# Patient Record
Sex: Female | Born: 1965 | Race: White | Hispanic: No | Marital: Married | Smoking: Former smoker
Health system: Southern US, Community
[De-identification: ages and names within clinical notes are randomized; demographics above are authoritative.]

## PROBLEM LIST (undated history)

## (undated) DIAGNOSIS — Z9889 Other specified postprocedural states: Secondary | ICD-10-CM

## (undated) DIAGNOSIS — O039 Complete or unspecified spontaneous abortion without complication: Secondary | ICD-10-CM

## (undated) DIAGNOSIS — G43909 Migraine, unspecified, not intractable, without status migrainosus: Secondary | ICD-10-CM

## (undated) DIAGNOSIS — I10 Essential (primary) hypertension: Secondary | ICD-10-CM

## (undated) DIAGNOSIS — E119 Type 2 diabetes mellitus without complications: Secondary | ICD-10-CM

## (undated) DIAGNOSIS — T4145XA Adverse effect of unspecified anesthetic, initial encounter: Secondary | ICD-10-CM

## (undated) DIAGNOSIS — R112 Nausea with vomiting, unspecified: Secondary | ICD-10-CM

## (undated) DIAGNOSIS — T8859XA Other complications of anesthesia, initial encounter: Secondary | ICD-10-CM

## (undated) DIAGNOSIS — N643 Galactorrhea not associated with childbirth: Secondary | ICD-10-CM

## (undated) DIAGNOSIS — O926 Galactorrhea: Secondary | ICD-10-CM

## (undated) HISTORY — DX: Migraine, unspecified, not intractable, without status migrainosus: G43.909

## (undated) HISTORY — DX: Galactorrhea not associated with childbirth: N64.3

## (undated) HISTORY — DX: Complete or unspecified spontaneous abortion without complication: O03.9

## (undated) HISTORY — PX: SALPINGOOPHORECTOMY: SHX82

## (undated) HISTORY — PX: KNEE ARTHROSCOPY: SUR90

## (undated) HISTORY — DX: Galactorrhea: O92.6

## (undated) HISTORY — DX: Essential (primary) hypertension: I10

## (undated) HISTORY — PX: DILATION AND CURETTAGE OF UTERUS: SHX78

## (undated) HISTORY — PX: ECTOPIC PREGNANCY SURGERY: SHX613

---

## 2015-07-24 ENCOUNTER — Emergency Department (HOSPITAL_COMMUNITY): Payer: BC Managed Care – PPO

## 2015-07-24 ENCOUNTER — Encounter (HOSPITAL_COMMUNITY): Payer: Self-pay | Admitting: Emergency Medicine

## 2015-07-24 ENCOUNTER — Inpatient Hospital Stay (HOSPITAL_COMMUNITY)
Admission: EM | Admit: 2015-07-24 | Discharge: 2015-07-25 | DRG: 313 | Disposition: A | Discharge status: Home / Self Care | Payer: BC Managed Care – PPO | Attending: Student in an Organized Health Care Education/Training Program | Admitting: Student in an Organized Health Care Education/Training Program

## 2015-07-24 DIAGNOSIS — Z7984 Long term (current) use of oral hypoglycemic drugs: Secondary | ICD-10-CM | POA: Diagnosis not present

## 2015-07-24 DIAGNOSIS — Z79899 Other long term (current) drug therapy: Secondary | ICD-10-CM

## 2015-07-24 DIAGNOSIS — I10 Essential (primary) hypertension: Secondary | ICD-10-CM | POA: Diagnosis present

## 2015-07-24 DIAGNOSIS — R0789 Other chest pain: Secondary | ICD-10-CM | POA: Diagnosis present

## 2015-07-24 DIAGNOSIS — R079 Chest pain, unspecified: Secondary | ICD-10-CM | POA: Diagnosis present

## 2015-07-24 DIAGNOSIS — Z87891 Personal history of nicotine dependence: Secondary | ICD-10-CM | POA: Diagnosis not present

## 2015-07-24 DIAGNOSIS — E1165 Type 2 diabetes mellitus with hyperglycemia: Secondary | ICD-10-CM | POA: Diagnosis present

## 2015-07-24 DIAGNOSIS — Z833 Family history of diabetes mellitus: Secondary | ICD-10-CM | POA: Diagnosis not present

## 2015-07-24 HISTORY — DX: Type 2 diabetes mellitus without complications: E11.9

## 2015-07-24 LAB — COMPREHENSIVE METABOLIC PANEL
ALBUMIN: 3.5 g/dL (ref 3.5–5.0)
ALK PHOS: 80 U/L (ref 38–126)
ALT: 18 U/L (ref 14–54)
ANION GAP: 10 (ref 5–15)
AST: 33 U/L (ref 15–41)
BUN: 14 mg/dL (ref 6–20)
CALCIUM: 8.7 mg/dL — AB (ref 8.9–10.3)
CO2: 24 mmol/L (ref 22–32)
Chloride: 101 mmol/L (ref 101–111)
Creatinine, Ser: 0.64 mg/dL (ref 0.44–1.00)
GFR calc Af Amer: >60 mL/min (ref >60)
GFR calc non Af Amer: >60 mL/min (ref >60)
GLUCOSE: 352 mg/dL — AB (ref 65–99)
Potassium: 4.1 mmol/L (ref 3.5–5.1)
SODIUM: 135 mmol/L (ref 135–145)
Total Bilirubin: 0.9 mg/dL (ref 0.3–1.2)
Total Protein: 6.5 g/dL (ref 6.5–8.1)

## 2015-07-24 LAB — CBC WITH DIFFERENTIAL/PLATELET
BASOS ABS: 0 10*3/uL (ref 0.0–0.1)
BASOS PCT: 0 %
EOS ABS: 0.2 10*3/uL (ref 0.0–0.7)
Eosinophils Relative: 3 %
HCT: 41.9 % (ref 36.0–46.0)
HEMOGLOBIN: 13.9 g/dL (ref 12.0–15.0)
Lymphocytes Relative: 29 %
Lymphs Abs: 2.3 10*3/uL (ref 0.7–4.0)
MCH: 28.4 pg (ref 26.0–34.0)
MCHC: 33.2 g/dL (ref 30.0–36.0)
MCV: 85.5 fL (ref 78.0–100.0)
Monocytes Absolute: 0.5 10*3/uL (ref 0.1–1.0)
Monocytes Relative: 6 %
NEUTROS PCT: 62 %
Neutro Abs: 5.1 10*3/uL (ref 1.7–7.7)
Platelets: 198 10*3/uL (ref 150–400)
RBC: 4.9 MIL/uL (ref 3.87–5.11)
RDW: 14.3 % (ref 11.5–15.5)
WBC: 8 10*3/uL (ref 4.0–10.5)

## 2015-07-24 LAB — I-STAT TROPONIN, ED: TROPONIN I, POC: 0 ng/mL (ref 0.00–0.08)

## 2015-07-24 LAB — POC URINE PREG, ED: Preg Test, Ur: NEGATIVE

## 2015-07-24 LAB — CBG MONITORING, ED: Glucose-Capillary: 331 mg/dL — ABNORMAL HIGH (ref 65–99)

## 2015-07-24 LAB — GLUCOSE, CAPILLARY: GLUCOSE-CAPILLARY: 350 mg/dL — AB (ref 65–99)

## 2015-07-24 LAB — D-DIMER, QUANTITATIVE: D-Dimer, Quant: 0.37 ug/mL-FEU (ref 0.00–0.48)

## 2015-07-24 MED ORDER — SODIUM CHLORIDE 0.9 % IJ SOLN: 3.0000 mL | INTRAMUSCULAR | Status: DC | PRN

## 2015-07-24 MED ORDER — ENOXAPARIN SODIUM 40 MG/0.4ML ~~LOC~~ SOLN
40.0000 mg | SUBCUTANEOUS | Status: DC
Start: 2015-07-25 — End: 2015-07-25
  Filled 2015-07-24: qty 0.4

## 2015-07-24 MED ORDER — ASPIRIN EC 81 MG PO TBEC
81.0000 mg | DELAYED_RELEASE_TABLET | Freq: Every day | ORAL | Status: DC
  Administered 2015-07-25: 81 mg via ORAL
  Filled 2015-07-24: qty 1

## 2015-07-24 MED ORDER — ATORVASTATIN CALCIUM 40 MG PO TABS
40.0000 mg | ORAL_TABLET | Freq: Every day | ORAL | Status: DC
  Administered 2015-07-24: 40 mg via ORAL
  Filled 2015-07-24: qty 1

## 2015-07-24 MED ORDER — INSULIN ASPART 100 UNIT/ML ~~LOC~~ SOLN
0.0000 [IU] | SUBCUTANEOUS | Status: DC
  Administered 2015-07-24: 11 [IU] via SUBCUTANEOUS
  Administered 2015-07-25: 8 [IU] via SUBCUTANEOUS
  Administered 2015-07-25: 3 [IU] via SUBCUTANEOUS
  Administered 2015-07-25: 11 [IU] via SUBCUTANEOUS

## 2015-07-24 MED ORDER — SODIUM CHLORIDE 0.9 % IV SOLN: 250.0000 mL | INTRAVENOUS | Status: DC | PRN

## 2015-07-24 MED ORDER — IPRATROPIUM-ALBUTEROL 0.5-2.5 (3) MG/3ML IN SOLN
3.0000 mL | Freq: Once | RESPIRATORY_TRACT | Status: AC
  Administered 2015-07-24: 3 mL via RESPIRATORY_TRACT
  Filled 2015-07-24: qty 3

## 2015-07-24 MED ORDER — NITROGLYCERIN 0.4 MG SL SUBL: 0.4000 mg | SUBLINGUAL_TABLET | SUBLINGUAL | Status: DC | PRN

## 2015-07-24 MED ORDER — SODIUM CHLORIDE 0.9 % IV BOLUS (SEPSIS)
1000.0000 mL | Freq: Once | INTRAVENOUS | Status: AC
  Administered 2015-07-24: 1000 mL via INTRAVENOUS

## 2015-07-24 MED ORDER — ACETAMINOPHEN 325 MG PO TABS
650.0000 mg | ORAL_TABLET | ORAL | Status: DC | PRN
  Administered 2015-07-25 (×2): 650 mg via ORAL
  Filled 2015-07-24 (×2): qty 2

## 2015-07-24 MED ORDER — SODIUM CHLORIDE 0.9 % IJ SOLN
3.0000 mL | Freq: Two times a day (BID) | INTRAMUSCULAR | Status: DC
  Administered 2015-07-24: 3 mL via INTRAVENOUS

## 2015-07-24 MED ORDER — ONDANSETRON HCL 4 MG/2ML IJ SOLN: 4.0000 mg | Freq: Four times a day (QID) | INTRAMUSCULAR | Status: DC | PRN

## 2015-07-24 MED ORDER — METOPROLOL TARTRATE 1 MG/ML IV SOLN
2.5000 mg | Freq: Once | INTRAVENOUS | Status: AC
  Administered 2015-07-24: 2.5 mg via INTRAVENOUS
  Filled 2015-07-24: qty 5

## 2015-07-24 NOTE — ED Notes (Signed)
PT describes the chest pain as "heartburn" causing nausea.

## 2015-07-24 NOTE — ED Provider Notes (Signed)
CSN: [id number]     Arrival date & time 07/24/15  1753 History   First MD Initiated Contact with Patient 07/24/15 1758     Chief Complaint  Patient presents with  . Fatigue  . Dizziness  . Nausea  . Chest Pain     (Consider location/radiation/quality/duration/timing/severity/associated sxs/prior Treatment) HPI   Blood pressure 153/89, pulse 73, temperature 98.3 F (36.8 C), resp. rate 18, height 5\' 2"  (1.575 m), weight 223 lb (101.152 kg), SpO2 96 %.  Ander SladeJoy Ola SpurrFrazer is a 49 y.o. female complaining of lightheaded sensation with associated fatigue and chest discomfort described as burning, tightness radiating to the left arm. Patient denies fever, chills, chest pain, shortness of breath, abdominal pain, nausea, vomiting, patient is under a lot of stress having recently moved to [place] from [place]. Last long trip was 2 months ago. She endorses a right lower extremity pain and swelling. She is non-insulin dependent diabetic and has not had her metformin cannot find medications removed. Former smoker with 10 pack year history, quit 20 years ago. Patient has hyperlipidemia not treated pharmacologically. No family history of ACS. Given full dose aspirin at urgent care since the ED for further evaluation. No prior cardiac history. Has not established local primary care. 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 They've been living with friends for 2 months and recently found a apartment. She denies anxiety, suicidal ideation, homicidal ideation, alcohol or drug abuse.  Past Medical History  Diagnosis Date  . Diabetes mellitus without complication [facility](HCC)    Past Surgical History  Procedure Laterality Date  . Ectopic pregnancy surgery    . Miscarraige      X 8   History reviewed. No pertinent family history. Social History  Substance Use  Topics  . Smoking status: Never Smoker   . Smokeless tobacco: None  . Alcohol Use: No   OB History    No data available     Review of Systems  10 systems reviewed and found to be negative, except as noted in the HPI.   Allergies  Review of patient's allergies indicates no known allergies.  Home Medications   Prior to Admission medications   Medication Sig Start Date End Date Taking? Authorizing Provider  glipiZIDE (GLUCOTROL) 5 MG tablet Take 5 mg by mouth daily before breakfast.   Yes Historical Provider, MD  ibuprofen (ADVIL,MOTRIN) 400 MG tablet Take 400 mg by mouth every 6 (six) hours as needed for mild pain.   Yes Historical Provider, MD  lisinopril (PRINIVIL,ZESTRIL) 5 MG tablet Take 5 mg by mouth daily as needed (elevated pressure).   Yes Historical Provider, MD  metFORMIN (GLUCOPHAGE) 1000 MG tablet Take 1,000 mg by mouth 2 (two) times daily with a meal.   Yes Historical Provider, MD   BP 156/64 mmHg  Pulse 81  Temp(Src) 98.3 F (36.8 C)  Resp 14  Ht 5\' 2"  (1.575 m)  Wt 223 lb (101.152 kg)  BMI 40.78 kg/m2  SpO2 96%  LMP  Physical Exam  Constitutional: She is oriented to person, place, and time. She appears well-developed and well-nourished. No distress.  HENT:  Head: Normocephalic.  Mouth/Throat: Oropharynx is clear and moist.  Eyes: Conjunctivae are normal.  Neck: Normal range of motion. No JVD present. No tracheal deviation present.  Cardiovascular: Normal rate, regular rhythm and intact distal pulses.  Radial pulse equal bilaterally  Pulmonary/Chest: Effort normal and breath sounds normal. No stridor. No respiratory distress. She has no wheezes. She has no rales. She exhibits no tenderness.  Abdominal: Soft. She exhibits no distension and no mass. There is no tenderness. There is no rebound and no guarding.  Musculoskeletal: Normal range of motion. She exhibits edema and tenderness.  No calf asymmetry, superficial collaterals, palpable cords,   Trace  edema to left foot, Homans sign is positive on the left side   Neurological: She is alert and oriented to person, place, and time.  Skin: Skin is warm. She is not diaphoretic.  Psychiatric: She has a normal mood and affect.  Nursing note and vitals reviewed.   ED Course  Procedures (including critical care time) Labs Review Labs Reviewed  COMPREHENSIVE METABOLIC PANEL - Abnormal; Notable for the following:    Glucose, Bld 352 (*)    Calcium 8.7 (*)    All other components within normal limits  CBG MONITORING, ED - Abnormal; Notable for the following:    Glucose-Capillary 331 (*)    All other components within normal limits  CBC WITH DIFFERENTIAL/PLATELET  D-DIMER, QUANTITATIVE (NOT AT [facility])  Rosezena Sensor, ED    Imaging Review Dg Chest 2 View  07/24/2015  CLINICAL DATA:  Chest pain x 1 day EXAM: CHEST  2 VIEW COMPARISON:  None. FINDINGS: The heart size and mediastinal contours are within normal limits. Both lungs are clear. The visualized skeletal structures are unremarkable. IMPRESSION: No active cardiopulmonary disease. Electronically Signed   By: Norva Pavlov M.D.   On: 07/24/2015 19:07   I have personally reviewed and evaluated these images and lab results as part of my medical decision-making.   EKG Interpretation   Date/Time:  Thursday July 24 2015 18:05:52 EDT Ventricular Rate:  74 PR Interval:  150 QRS Duration: 110 QT Interval:  417 QTC Calculation: 463 R Axis:   75 Text Interpretation:  Sinus rhythm Low voltage, precordial leads RSR' in  V1 or V2, right VCD or RVH Confirmed by Rubin Payor  MD, NATHAN [phone]) on  07/24/2015 6:13:33 PM      MDM   Final diagnoses:  Chest pain, unspecified chest pain type    Filed Vitals:   07/24/15 1807 07/24/15 1830 07/24/15 1846 07/24/15 1936  BP: 153/89 143/68 157/57 156/64  Pulse: 78 72 68 81  Temp: 98.3 F (36.8 C)     Resp: Height:  (1.575 m)     Weight: 223 lb (101.152 kg)     SpO2:  97% 97% 100% 96%    Medications  sodium chloride 0.9 % bolus 1,000 mL (1,000 mLs Intravenous New Bag/Given 07/24/15 1935)  sodium chloride 0.9 % bolus 1,000 mL (not administered)  nitroGLYCERIN (NITROSTAT) SL tablet 0.4 mg (not administered)  ipratropium-albuterol (DUONEB) 0.5-2.5 (3) MG/3ML nebulizer solution 3 mL (3 mLs Nebulization Given 07/24/15 1847)    Carmalita Wakefield is 49 y.o. female presenting with lightheadedness fatigue and chest discomfort intermittently over the last week. EKG with mild ST depression in lead 3 and aVF, no prior for comparison. Patient is moderate risk by heart score. Troponin negative will need chest pain admission  's will be an unassigned admission to internal medicine teaching service    [facility], PA-C 07/24/15 1944  Benjiman Core, MD 07/25/15 0006

## 2015-07-24 NOTE — ED Notes (Addendum)
Per EMS-pt diabetic, denies cardiac history but does take Lisinopril occassionaly recently moved to [place]. Has not been taking meds because she lost them. Pt has had chest pain with dizziness, fatigue and nausea for a week. Pt given 324 ASA PTA. LAC 20G placed. EKG showed some depression. CBG 300s

## 2015-07-24 NOTE — H&P (Signed)
Date: 07/24/2015               Patient Name:  Catherine Newman MRN: [id number]  DOB: 10-31-1965 Age / Sex: 49 y.o., female   PCP: Jolene Provost, MD         Medical Service: Internal Medicine Teaching Service         Attending Physician: Dr. Tyson Alias, MD    First Contact: Dr. Wonda Cerise, MD Pager: [phone]  Second Contact: Dr. Boykin Peek, MD Pager: [phone]       After Hours (After 5p/  First Contact Pager: [phone]  weekends / holidays): Second Contact Pager: [phone]   Chief Complaint: Chest Pain  History of Present Illness:  Catherine Newman is a pleasant 49 year old woman with a past medical history of HTN and T2DM who presents with chest pain and other various complaints. Her chest pain she describes as a "tightness" in the center of the chest. It started 2-3 days ago,  is worse with deep inspiration, and may radiate to her left arm when she lifts her arm above her head. It somewhat worsens with exertion, such as walking a long distance and improves when she is sitting completely still. Her other acute complaints are neck tightness, nausea, blurred near vision (she lost her contacts during her move and is using non-bifocal glasses), and fatigue. She also endorses chronic dyspnea on exertion, reporting that she becomes short of breat after walking two blocks. She also has longstanding bilateral leg swelling (left worse than right), occasional leg weakness in both legs, intermittent constipation, and light-headedness upon standing or after intense concentration. She denies any vomiting, diarrhea, abdominal pain, fever, symptoms of acid reflux, or orthopnea.   Her diabetes and HTN were managed by Dr. De Nurse in [place], [place]. She reports that four months ago he Hgb A1c was 11. She denied any symptoms of dysuria, polydypsia, or known sequelae of long term uncontrolled diabetes. She usually takes Metformin 1000 mg BID, but has not taken any since moving from [place], [place]  two months ago. She takes glipizide 5 mg daily as well. For her hypertension, she takes lisinopril 5 mg, but only "as needed" when she notices her blood pressure is too high at home. She avoids taking lisinopril every day, because she says her blood pressure drops too low with "the bottom number in the 60s" and she feels "crummy." The only other medicine she takes is ibuprofen for various headaches, body aches, and other pains. She takes no more than four 200 mg tablets at a time no more than 3 times a day. Her only previous surgery was for an ectopic pregnancy and she has had eight miscarriages. She has a family history of T1DM, T2DM - but no cardiovascular disease. 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 She is married with one adopted child. She quit smoking 20 years ago with a 10 pack-year history. She does not drink or use drugs. She has many stressors in her life that she feels is taking a toll on her.  In the ED, a d-dimer, I-stat troponin were negative. Her BGs were in the mid-300s. CXR was normal. Urine pregnancy negative.   Meds: Current Facility-Administered Medications  Medication Dose Route Frequency Provider Last Rate Last Dose  . metoprolol (LOPRESSOR) injection 2.5 mg  2.5 mg Intravenous Once Lora Paula, MD      .  nitroGLYCERIN (NITROSTAT) SL tablet 0.4 mg  0.4 mg Sublingual Q5 Min x 3 PRN Wynetta Emery, PA-C       Current Outpatient Prescriptions  Medication Sig Dispense Refill  . glipiZIDE (GLUCOTROL) 5 MG tablet Take 5 mg by mouth daily before breakfast.    . ibuprofen (ADVIL,MOTRIN) 400 MG tablet Take 400 mg by mouth every 6 (six) hours as needed for mild pain.    Marland Kitchen lisinopril (PRINIVIL,ZESTRIL) 5 MG tablet Take 5 mg by mouth daily as needed (elevated pressure).    . metFORMIN (GLUCOPHAGE) 1000 MG tablet Take 1,000 mg by mouth 2 (two) times daily with a meal.       Allergies: Allergies as of 07/24/2015  . (No Known Allergies)   Past Medical History  Diagnosis Date  . Diabetes mellitus without complication [facility])    Past Surgical History  Procedure Laterality Date  . Ectopic pregnancy surgery    . Miscarraige      X 8   History reviewed. No pertinent family history. Social History   Social History  . Marital Status: Married    Spouse Name: N/A  . Number of Children: N/A  . Years of Education: N/A   Occupational History  . Not on file.   Social History Main Topics  . Smoking status: Never Smoker   . Smokeless tobacco: Not on file  . Alcohol Use: No  . Drug Use: Not on file  . Sexual Activity: Not on file   Other Topics Concern  . Not on file   Social History Narrative  . No narrative on file    Review of Systems: Negative Except per HPI  Physical Exam: Blood pressure 191/85, pulse 75, temperature 98.3 F (36.8 C), resp. rate 15, height  (1.575 m), weight 223 lb (101.152 kg), SpO2 99 %. General: Obese woman lying in bed, no acute distress HEENT: Moist mucous membranes, no tonsillar erythema or exudates, EOMI, PERRL Cardiovascular/Chest:  RRR, no m/r/g. Chest pain is reproducible on palpation of center chest and left shoulder Pulmonary: CTAB Abdominal: Soft, NTND. Normal BS Extremities: Trace edema, with mildly increased circumference of left extremity versus right without any swelling or calf tenderness. 2+ DP bilaterally. Neurological: 5/5 strength in all extremities. Tongue midline. Face symmetric. Normal sensation in distal extremities.  Psychiatric: Behavior and affect appropriate.   Lab results: Basic Metabolic Panel:  Recent Labs  81/19/14 1822  NA 135  K 4.1  CL 101  CO2 24  GLUCOSE 352*  BUN 14  CREATININE 0.64  CALCIUM 8.7*   Liver Function Tests:  Recent Labs  07/24/15 1822  AST 33  ALT 18  ALKPHOS 80  BILITOT 0.9  PROT 6.5  ALBUMIN 3.5   CBC:  Recent Labs  07/24/15 1822   WBC 8.0  NEUTROABS 5.1  HGB 13.9  HCT 41.9  MCV 85.5  PLT 198   D-Dimer:  Recent Labs  07/24/15 1822  DDIMER 0.37   CBG:  Recent Labs  07/24/15 1849  GLUCAP 331*    Imaging results:  Dg Chest 2 View  07/24/2015  CLINICAL DATA:  Chest pain x 1 day EXAM: CHEST  2 VIEW COMPARISON:  None. FINDINGS: The heart size and mediastinal contours are within normal limits. Both lungs are clear. The visualized skeletal structures are unremarkable. IMPRESSION: No active cardiopulmonary disease. Electronically Signed   By: Norva Pavlov M.D.   On: 07/24/2015 19:07    EKG Ventricular Rate: 74 PR Interval: 150 QRS Duration: 110 QT  Interval: 417 QTC Calculation: 463 R Axis: 75 Text Interpretation: Sinus rhythm Low voltage, precordial leads RSR' in  V1 or V2, right VCD or RVH   Assessment & Plan by Problem:  Chest Pain: Heart score of 5 (moderate); however, description of chest and physical exam are not consistent with ACS. Could be musculoskeletal, perhaps having to do with moving boxes during her recent move. Variation with breathing suggests asthma or infection, although we do not have concern for the latter at this time. - NGT SL 0.4 mg q 5 min prn - Atorvastatin 40 mg daily - would benefit from moderate intensity statin (10-20 mg once daily of Atorvastatin) unless of 10 year ASCVD risk is greater than 7.5%, then high intensity - ASA 81 mg - Acetaminophen prn - Trending Troponins - Repeat EKG in AM - Lipid panel  - Consider consulting cardiology in the morning  Shortness of Breath: Differential includes asthma, CHF, deconditioning chronic PEs (although d-dimer negative). She denies any symptoms of orthopnea at this time.  - Consider cardiopulmonary stress testing as at outpatient - Consider TTE  Type 2 Diabetes: Last A1c of 11, and not taking her medications as prescribed. BGs in mid-300s on admission. Could be related to blurry vision, HA, and weakness. - A1c  pending - Moderate SSI  HTN: 191/85 on admission. Not taking lisinopril as prescribed. - Given one dose of metoprolol 2.5 mg  DVT Prophylaxis: Enoxaparin [place] Diet: NPO Code Status: Full  Dispo: Disposition is deferred at this time, awaiting improvement of current medical problems. Anticipated discharge in approximately 2-3 day(s).   The patient does have a current PCP Jolene Provost(David M Haimes, MD) and does not need an [facility] hospital follow-up appointment after discharge.  The patient does not have transportation limitations that hinder transportation to clinic appointments.  Signed: Ruben ImJeremy Sylvestre Rathgeber, MD 07/24/2015, 9:35 PM

## 2015-07-25 DIAGNOSIS — E1165 Type 2 diabetes mellitus with hyperglycemia: Secondary | ICD-10-CM

## 2015-07-25 DIAGNOSIS — R0789 Other chest pain: Principal | ICD-10-CM

## 2015-07-25 DIAGNOSIS — R079 Chest pain, unspecified: Secondary | ICD-10-CM | POA: Insufficient documentation

## 2015-07-25 DIAGNOSIS — Z7984 Long term (current) use of oral hypoglycemic drugs: Secondary | ICD-10-CM

## 2015-07-25 LAB — GLUCOSE, CAPILLARY
GLUCOSE-CAPILLARY: 276 mg/dL — AB (ref 65–99)
GLUCOSE-CAPILLARY: 336 mg/dL — AB (ref 65–99)
Glucose-Capillary: 193 mg/dL — ABNORMAL HIGH (ref 65–99)

## 2015-07-25 LAB — TSH: TSH: 2.235 u[IU]/mL (ref 0.350–4.500)

## 2015-07-25 LAB — TROPONIN I
Troponin I: <0.03 ng/mL (ref <0.031)
Troponin I: <0.03 ng/mL (ref <0.031)

## 2015-07-25 LAB — BASIC METABOLIC PANEL
ANION GAP: 5 (ref 5–15)
BUN: 15 mg/dL (ref 6–20)
CHLORIDE: 103 mmol/L (ref 101–111)
CO2: 27 mmol/L (ref 22–32)
Calcium: 8.4 mg/dL — ABNORMAL LOW (ref 8.9–10.3)
Creatinine, Ser: 0.76 mg/dL (ref 0.44–1.00)
GFR calc non Af Amer: >60 mL/min (ref >60)
Glucose, Bld: 290 mg/dL — ABNORMAL HIGH (ref 65–99)
POTASSIUM: 3.8 mmol/L (ref 3.5–5.1)
SODIUM: 135 mmol/L (ref 135–145)

## 2015-07-25 LAB — RAPID URINE DRUG SCREEN, HOSP PERFORMED
AMPHETAMINES: NOT DETECTED
BARBITURATES: NOT DETECTED
Benzodiazepines: NOT DETECTED
Cocaine: NOT DETECTED
Opiates: NOT DETECTED
TETRAHYDROCANNABINOL: NOT DETECTED

## 2015-07-25 LAB — CBC
HEMATOCRIT: 41.1 % (ref 36.0–46.0)
Hemoglobin: 13.4 g/dL (ref 12.0–15.0)
MCH: 28.2 pg (ref 26.0–34.0)
MCHC: 32.6 g/dL (ref 30.0–36.0)
MCV: 86.3 fL (ref 78.0–100.0)
PLATELETS: 171 10*3/uL (ref 150–400)
RBC: 4.76 MIL/uL (ref 3.87–5.11)
RDW: 14.4 % (ref 11.5–15.5)
WBC: 7.1 10*3/uL (ref 4.0–10.5)

## 2015-07-25 LAB — LIPID PANEL
CHOL/HDL RATIO: 6.6 ratio
Cholesterol: 178 mg/dL (ref 0–200)
HDL: 27 mg/dL — ABNORMAL LOW (ref >40)
LDL CALC: 112 mg/dL — AB (ref 0–99)
Triglycerides: 195 mg/dL — ABNORMAL HIGH (ref <150)
VLDL: 39 mg/dL (ref 0–40)

## 2015-07-25 MED ORDER — GLIPIZIDE 10 MG PO TABS: 10.0000 mg | ORAL_TABLET | Freq: Every day | ORAL | Status: DC

## 2015-07-25 NOTE — Progress Notes (Signed)
Inpatient Diabetes Program Recommendations  AACE/ADA: New Consensus Statement on Inpatient Glycemic Control (2015)  Target Ranges:  Prepandial:   less than 140 mg/dL      Peak postprandial:   less than 180 mg/dL (1-2 hours)      Critically ill patients:  140 - 180 mg/dL   Results for Jonny RuizFRAZER, Witney (MRN [id number]) as of 07/25/2015 11:50  Ref. Range 07/24/2015 18:49 07/24/2015 23:34 07/25/2015 04:11 07/25/2015 07:45  Glucose-Capillary Latest Ref Range: 65-99 mg/dL 725331 (H) 366350 (H) 440276 (H) 193 (H)    Admit with: CP  History: DM, HTN  Home DM Meds: Glipizide 5 mg daily       Metformin 1000 bid (patient not taking for 2 months)  Current Insulin Orders: Novolog Moderate SSI (0-15 units) Q4 hours    -Note current A1c pending.  Per MD notes, patient stated her last A1c was around 11%.  -Also note patient stated she has not been taking her Metformin for approximately 2 months now.  -May need to start insulin at home??    --Will follow patient during hospitalization--  Ambrose FinlandJeannine Johnston Evellyn Tuff RN, MSN, CDE Diabetes Coordinator Inpatient Glycemic Control Team Team Pager: [phone] (8a-5p)

## 2015-07-25 NOTE — Progress Notes (Signed)
Pt got discharged, discharge instructions provided and patient showed understanding to it, IV taken out,Telemonitor DC,pt left unit in wheelchair with all of the belongings. 

## 2015-07-25 NOTE — Progress Notes (Addendum)
Spoke with patient this afternoon about her DM care at home.  Patient told me she has been under a lot of stress at home (her mother recently diagnosed with cancer, recent move to the area from [place]).  Knows about the importance of good CBG control and is frustrated that she is struggling with her control at home.  Has used Metformin in the past but has had lots of GI upset with this drug.  Is willing to try Metformin again, however, she stated she would like to try to decrease her dose to 500 mg bid instead of 1000 mg bid.  Discussed with patient other DM medication options.  Encouraged patient to check her CBGs frequently at home and also reviewed blood sugar goals.  Discussed basic carbohydrate counting with patient and how to read a food label. Encouraged patient to avoid sweetened beverages and to limit desserts. Also encouraged patient to pay attention to serving sizes. Discussed with patient that women should have between 45-60 grams of carbohydrates per meal per day.  Also encouraged patient to seek care under a PCP and possibly an Endocrinologist here in town (since patient recently moved to the area).  Patient very agreeable to this and stated she would look for a PCP and possibly an ENDO soon after d/c.   --Will follow patient during hospitalization--  Ambrose FinlandJeannine Johnston Donnie Panik RN, MSN, CDE Diabetes Coordinator Inpatient Glycemic Control Team Team Pager: [phone] (8a-5p)

## 2015-07-25 NOTE — Progress Notes (Signed)
Subjective: Catherine Newman is a 49yo F with PMH HTN and T2DM who is admitted for chest tightness. She says she is feeling much better since last night, denies any other symptoms such as fever, shortness of breath, palpitations, abdominal pain, nausea, or any other issues.  Objective: Vital signs in last 24 hours: Filed Vitals:   07/25/15 0413 07/25/15 0816 07/25/15 1053 07/25/15 1210  BP: 154/80 177/73 141/58 165/85  Pulse: 71 64 73 66  Temp: 98.1 F (36.7 C) 97.9 F (36.6 C)  97.7 F (36.5 C)  TempSrc: Oral Oral  Oral  Resp: 18 18 14 18   Height:      Weight: 222 lb 12.8 oz (101.061 kg)     SpO2: 97% 99% 99% 99%   Weight change:   Intake/Output Summary (Last 24 hours) at 07/25/15 1329 Last data filed at 07/25/15 1252  Gross per 24 hour  Intake    320 ml  Output    900 ml  Net   -580 ml   BP 165/85 mmHg  Pulse 66  Temp(Src) 97.7 F (36.5 C) (Oral)  Resp 18  Ht 5\' 2"  (1.575 m)  Wt 222 lb 12.8 oz (101.061 kg)  BMI 40.74 kg/m2  SpO2 99%  LMP   General Appearance:    Alert, cooperative, no distress, appears stated age  Lungs:     Clear to auscultation bilaterally, respirations unlabored  Chest Wall:    Mild tenderness over the left parasternal border   Heart:    Regular rate and rhythm, S1 and S2 normal, no murmur, rub   or gallop  Abdomen:     Soft, non-tender, bowel sounds active all four quadrants,    no masses, no organomegaly  Extremities:   Extremities normal, atraumatic, no cyanosis or edema  Pulses:   2+ and symmetric all extremities  Skin:   Skin color, texture, turgor normal, no rashes or lesions  Lymph nodes:   Cervical, supraclavicular, and axillary nodes normal  Neurologic:   CNII-XII intact, normal strength, sensation and reflexes    throughout   Lab Results: Basic Metabolic Panel:  Recent Labs Lab 07/24/15 1822 07/25/15 0523  NA 135 135  K 4.1 3.8  CL 101 103  CO2 24 27  GLUCOSE 352* 290*  BUN 14 15  CREATININE 0.64 0.76  CALCIUM 8.7* 8.4*     Liver Function Tests:  Recent Labs Lab 07/24/15 1822  AST 33  ALT 18  ALKPHOS 80  BILITOT 0.9  PROT 6.5  ALBUMIN 3.5   CBC:  Recent Labs Lab 07/24/15 1822 07/25/15 0523  WBC 8.0 7.1  NEUTROABS 5.1  --   HGB 13.9 13.4  HCT 41.9 41.1  MCV 85.5 86.3  PLT 198 171   Cardiac Enzymes:  Recent Labs Lab 07/25/15 0056 07/25/15 0528  TROPONINI <0.03 <0.03   D-Dimer:  Recent Labs Lab 07/24/15 1822  DDIMER 0.37   CBG:  Recent Labs Lab 07/24/15 1849 07/24/15 2334 07/25/15 0411 07/25/15 0745 07/25/15 1209  GLUCAP 331* 350* 276* 193* 336*   Fasting Lipid Panel:  Recent Labs Lab 07/25/15 0523  CHOL 178  HDL 27*  LDLCALC 112*  TRIG 195*  CHOLHDL 6.6   Thyroid Function Tests:  Recent Labs Lab 07/25/15 0056  TSH 2.235   Urine Drug Screen: Drugs of Abuse     Component Value Date/Time   LABOPIA NONE DETECTED 07/25/2015 1040   COCAINSCRNUR NONE DETECTED 07/25/2015 1040   LABBENZ NONE DETECTED 07/25/2015 1040  AMPHETMU NONE DETECTED 07/25/2015 1040   THCU NONE DETECTED 07/25/2015 1040   LABBARB NONE DETECTED 07/25/2015 1040    Studies/Results: Dg Chest 2 View  07/24/2015  CLINICAL DATA:  Chest pain x 1 day EXAM: CHEST  2 VIEW COMPARISON:  None. FINDINGS: The heart size and mediastinal contours are within normal limits. Both lungs are clear. The visualized skeletal structures are unremarkable. IMPRESSION: No active cardiopulmonary disease. Electronically Signed   By: Norva Pavlov M.D.   On: 07/24/2015 19:07   Assessment/Plan: Active Problems:   Chest pain 1. Chest pain - moderate risk of CP via heart score (5), presentation, trops negative, EKG normal (but outside h/o "ST depression" at urgent care), has been moving boxes recently and under tremendous stress since moving to [place] recently. Now improved.  -Statin -ASA -Acetaminophen PRN  2. DM2 - Not taking meds as prescribed, endorses bad GI sx with all doses of metformin, also on glipizide  5. Last A1c 11. -Will d/c metformin -Send out on glipizide 10 monotherapy for now  Dispo: Disposition is deferred at this time, awaiting improvement of current medical problems.  Anticipated discharge today.  The patient does have a current PCP Jolene Provost, MD) and does need an [facility] hospital follow-up appointment after discharge.  The patient does not have transportation limitations that hinder transportation to clinic appointments.  LOS: 1 day   Darrick Huntsman, MD 07/25/2015, 1:29 PM

## 2015-07-25 NOTE — Care Management Note (Signed)
Case Management Note  Patient Details  Name: Catherine Newman MRN: [id number] Date of Birth: 1966-06-01  Subjective/Objective:                    Action/Plan:Discharged home with no anticipated CM needs.    Expected Discharge Date:                  Expected Discharge Plan:  Home/Self Care  In-House Referral:     Discharge planning Services  CM Consult  Post Acute Care Choice:    Choice offered to:     DME Arranged:    DME Agency:     HH Arranged:    HH Agency:     Status of Service:  Completed, signed off  Medicare Important Message Given:    Date Medicare IM Given:    Medicare IM give by:    Date Additional Medicare IM Given:    Additional Medicare Important Message give by:     If discussed at Long Length of Stay Meetings, dates discussed:    Additional Comments:  Yvone NeuCrutchfield, Patriece Archbold M, RN 07/25/2015, 3:01 PM

## 2015-07-26 LAB — HEMOGLOBIN A1C
HEMOGLOBIN A1C: 12.3 % — AB (ref 4.8–5.6)
MEAN PLASMA GLUCOSE: 306 mg/dL

## 2015-07-26 NOTE — Discharge Summary (Signed)
Name: Catherine Newman MRN: [id number] DOB: Apr 22, 1966 49 y.o. PCP: Catherine Provostavid M Haimes, MD  Date of Admission: 07/24/2015  5:53 PM Date of Discharge: 07/26/2015 Attending Physician: No att. providers found  Discharge Diagnosis: 1. Atypical chest pain  Active Problems:   Chest pain   Pain in the chest  Discharge Medications:   Medication List    STOP taking these medications        metFORMIN 1000 MG tablet  Commonly known as:  GLUCOPHAGE      TAKE these medications        glipiZIDE 10 MG tablet  Commonly known as:  GLUCOTROL  Take 1 tablet (10 mg total) by mouth daily before breakfast.     ibuprofen 400 MG tablet  Commonly known as:  ADVIL,MOTRIN  Take 400 mg by mouth every 6 (six) hours as needed for mild pain.     lisinopril 5 MG tablet  Commonly known as:  PRINIVIL,ZESTRIL  Take 5 mg by mouth daily as needed (elevated pressure).        Disposition and follow-up:   [name] was [facility] in Good condition.  At the hospital follow up visit please address:  1.  Recurrence of symptoms, glycemic control/medication adherence, stressors/coping mechanisms  2.  Labs / imaging needed at time of follow-up: A1c  3.  Pending labs/ test needing follow-up: None  Follow-up Appointments:     Follow-up Information    Follow up with Catherine ProvostHAIMES,Catherine M, MD On 08/01/2015.   Specialty:  Family Medicine   Why:  post hospital follow up @ 10:30 .. confirmed w/ Melissa   Contact information:   [address] [place] [address] [phone]       Discharge Instructions: Discharge Instructions    Diet - low sodium heart healthy    Complete by:  As directed      Increase activity slowly    Complete by:  As directed            Consultations:    Procedures Performed:  Dg Chest 2 View  07/24/2015  CLINICAL DATA:  Chest pain x 1 day EXAM: CHEST  2 VIEW COMPARISON:  None. FINDINGS: The heart size and mediastinal contours are within  normal limits. Both lungs are clear. The visualized skeletal structures are unremarkable. IMPRESSION: No active cardiopulmonary disease. Electronically Signed   By: Norva PavlovElizabeth  Brown M.D.   On: 07/24/2015 19:07     Admission HPI: Mrs. Catherine Newman is a pleasant 49 year old woman with a past medical history of HTN and T2DM who presents with chest pain and other various complaints. Her chest pain she describes as a "tightness" in the center of the chest. It started 2-3 days ago, is worse with deep inspiration, and may radiate to her left arm when she lifts her arm above her head. It somewhat worsens with exertion, such as walking a long distance and improves when she is sitting completely still. Her other acute complaints are neck tightness, nausea, blurred near vision (she lost her contacts during her move and is using non-bifocal glasses), and fatigue. She also endorses chronic dyspnea on exertion, reporting that she becomes short of breat after walking two blocks. She also has longstanding bilateral leg swelling (left worse than right), occasional leg weakness in both legs, intermittent constipation, and light-headedness upon standing or after intense concentration. She denies any vomiting, diarrhea, abdominal pain, fever, symptoms of acid reflux, or orthopnea.   Her diabetes and HTN  were managed by Dr. De Nurse in [place], [place]. She reports that four months ago he Hgb A1c was 11. She denied any symptoms of dysuria, polydypsia, or known sequelae of long term uncontrolled diabetes. She usually takes Metformin 1000 mg BID, but has not taken any since moving from  [place], [place] two months ago. She takes glipizide 5 mg daily as well. For her hypertension, she takes lisinopril 5 mg, but only "as needed" when she notices her blood pressure is too high at home. She avoids taking lisinopril every day, because she says her blood pressure drops too low with "the bottom number in the 60s" and she feels "crummy." The only  other medicine she takes is ibuprofen for various headaches, body aches, and other pains. She takes no more than four 200 mg tablets at a time no more than 3 times a day. Her only previous surgery was for an ectopic pregnancy and she has had eight miscarriages. She has a family history of T1DM, T2DM - but no cardiovascular disease. 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 She is married with one adopted child. She quit smoking 20 years ago with a 10 pack-year history. She does not drink or use drugs. She has many stressors in her life that she feels is taking a toll on her.  In the ED, a d-dimer, I-stat troponin were negative. Her BGs were in the mid-300s. CXR was normal. Urine pregnancy negative.  Hospital Course by problem list: Active Problems:   Chest pain   Pain in the chest   1. Chest pain - patient was a moderate risk of CP via heart score (5), presentation, trops negative, EKG normal (but outside h/o "ST depression" at urgent care - was unable to see actual), had been moving boxes recently and under tremendous stress since moving to [place] recently. She had no significant events while hospitalized and was safe for discharge asymptomatic.  2. Hyperglycemia - patient was asymptomatic with sugars in the 200-300 range throughout her brief hospitalization. She admitted to not being compliant with her medications, metformin and glipizide, due to GI side effects. We switched her to glipizide  and d/c'ed the metformin for now, which was an initial plan she preferred and we agreed upon.  Discharge Vitals:   BP 165/85 mmHg  Pulse 66  Temp(Src) 97.7 F (36.5 C) (Oral)  Resp 18  Ht  (1.575 m)  Wt 222 lb 12.8 oz (101.061 kg)  BMI 40.74 kg/m2  SpO2 99%  LMP   Discharge Labs:  No results found for this or any previous visit (from the past 24 hour(s)).  Signed: Darrick Huntsman, MD 07/26/2015,  3:39 PM

## 2017-01-06 ENCOUNTER — Telehealth: Payer: Self-pay | Admitting: Neurology

## 2017-01-06 ENCOUNTER — Encounter (INDEPENDENT_AMBULATORY_CARE_PROVIDER_SITE_OTHER): Payer: Self-pay

## 2017-01-06 ENCOUNTER — Ambulatory Visit (INDEPENDENT_AMBULATORY_CARE_PROVIDER_SITE_OTHER): Payer: BC Managed Care – PPO | Admitting: Neurology

## 2017-01-06 ENCOUNTER — Encounter: Payer: Self-pay | Admitting: Neurology

## 2017-01-06 VITALS — BP 179/105 | Ht 61.0 in | Wt 206.6 lb

## 2017-01-06 DIAGNOSIS — R51 Headache with orthostatic component, not elsewhere classified: Secondary | ICD-10-CM

## 2017-01-06 DIAGNOSIS — H53461 Homonymous bilateral field defects, right side: Secondary | ICD-10-CM | POA: Diagnosis not present

## 2017-01-06 DIAGNOSIS — H547 Unspecified visual loss: Secondary | ICD-10-CM

## 2017-01-06 MED ORDER — LISINOPRIL 5 MG PO TABS: 5.0000 mg | ORAL_TABLET | Freq: Every day | ORAL | 5 refills | Status: DC

## 2017-01-06 NOTE — Patient Instructions (Addendum)
Remember to drink plenty of fluid, eat healthy meals and do not skip any meals. Try to eat protein with a every meal and eat a healthy snack such as fruit or nuts in between meals. Try to keep a regular sleep-wake schedule and try to exercise daily, particularly in the form of walking, 20-30 minutes a day, if you can.   As far as your medications are concerned, I would like to suggest: Lisinopril  daily  As far as diagnostic testing: MRI brain, labs, possibly a lumbar puncture  I would like to see you back in 8 weeks, sooner if we need to. Please call us with any interim questions, concerns, problems, updates or refill requests.   Our phone number is [phone]. We also have an after hours call service for urgent matters and there is a physician on-call for urgent questions. For any emergencies you know to call 911 or go to the nearest emergency room  Lisinopril tablets What is this medicine? LISINOPRIL (lyse IN oh pril) is an ACE inhibitor. This medicine is used to treat high blood pressure and heart failure. It is also used to protect the heart immediately after a heart attack. This medicine may be used for other purposes; ask your health care provider or pharmacist if you have questions. COMMON BRAND NAME(S): Prinivil, Zestril What should I tell my health care provider before I take this medicine? They need to know if you have any of these conditions: -diabetes -heart or blood vessel disease -kidney disease -low blood pressure -previous swelling of the tongue, face, or lips with difficulty breathing, difficulty swallowing, hoarseness, or tightening of the throat -an unusual or allergic reaction to lisinopril, other ACE inhibitors, insect venom, foods, dyes, or preservatives -pregnant or trying to get pregnant -breast-feeding How should I use this medicine? Take this medicine by mouth with a glass of water. Follow the directions on your prescription label. You may take this medicine  with or without food. If it upsets your stomach, take it with food. Take your medicine at regular intervals. Do not take it more often than directed. Do not stop taking except on your doctor's advice. Talk to your pediatrician regarding the use of this medicine in children. Special care may be needed. While this drug may be prescribed for children as young as 46 years of age for selected conditions, precautions do apply. Overdosage: If you think you have taken too much of this medicine contact a poison control center or emergency room at once. NOTE: This medicine is only for you. Do not share this medicine with others. What if I miss a dose? If you miss a dose, take it as soon as you can. If it is almost time for your next dose, take only that dose. Do not take double or extra doses. What may interact with this medicine? Do not take this medicine with any of the following medications: -hymenoptera venom -sacubitril; valsartan This medicines may also interact with the following medications: -aliskiren -angiotensin receptor blockers, like losartan or valsartan -certain medicines for diabetes -diuretics -everolimus -gold compounds -lithium -NSAIDs, medicines for pain and inflammation, like ibuprofen or naproxen -potassium salts or supplements -salt substitutes -sirolimus -temsirolimus This list may not describe all possible interactions. Give your health care provider a list of all the medicines, herbs, non-prescription drugs, or dietary supplements you use. Also tell them if you smoke, drink alcohol, or use illegal drugs. Some items may interact with your medicine. What should I watch for while using this  medicine? Visit your doctor or health care professional for regular check ups. Check your blood pressure as directed. Ask your doctor what your blood pressure should be, and when you should contact him or her. Do not treat yourself for coughs, colds, or pain while you are using this medicine  without asking your doctor or health care professional for advice. Some ingredients may increase your blood pressure. Women should inform their doctor if they wish to become pregnant or think they might be pregnant. There is a potential for serious side effects to an unborn child. Talk to your health care professional or pharmacist for more information. Check with your doctor or health care professional if you get an attack of severe diarrhea, nausea and vomiting, or if you sweat a lot. The loss of too much body fluid can make it dangerous for you to take this medicine. You may get drowsy or dizzy. Do not drive, use machinery, or do anything that needs mental alertness until you know how this drug affects you. Do not stand or sit up quickly, especially if you are an older patient. This reduces the risk of dizzy or fainting spells. Alcohol can make you more drowsy and dizzy. Avoid alcoholic drinks. Avoid salt substitutes unless you are told otherwise by your doctor or health care professional. What side effects may I notice from receiving this medicine? Side effects that you should report to your doctor or health care professional as soon as possible: -allergic reactions like skin rash, itching or hives, swelling of the hands, feet, face, lips, throat, or tongue -breathing problems -signs and symptoms of kidney injury like trouble passing urine or change in the amount of urine -signs and symptoms of increased potassium like muscle weakness; chest pain; or fast, irregular heartbeat -signs and symptoms of liver injury like dark yellow or brown urine; general ill feeling or flu-like symptoms; light-colored stools; loss of appetite; nausea; right upper belly pain; unusually weak or tired; yellowing of the eyes or skin -signs and symptoms of low blood pressure like dizziness; feeling faint or lightheaded, falls; unusually weak or tired -stomach pain with or without nausea and vomiting Side effects that  usually do not require medical attention (report to your doctor or health care professional if they continue or are bothersome): -changes in taste -cough -dizziness -fever -headache -sensitivity to light This list may not describe all possible side effects. Call your doctor for medical advice about side effects. You may report side effects to FDA at 1-800-FDA-1088. Where should I keep my medicine? Keep out of the reach of children. Store at room temperature between 15 and 30 degrees C (59 and 86 degrees F). Protect from moisture. Keep container tightly closed. Throw away any unused medicine after the expiration date. NOTE: This sheet is a summary. It may not cover all possible information. If you have questions about this medicine, talk to your doctor, pharmacist, or health care provider.  2018 Elsevier/Gold Standard (2015-11-03 12:52:35)

## 2017-01-06 NOTE — Telephone Encounter (Signed)
Please call patient regarding labs - she wants her husband to do some of them if possible [phone]

## 2017-01-06 NOTE — Progress Notes (Signed)
[facility]    Provider:  Dr Jaynee Eagles Referring Provider: Thalia Bloodgood OD  CC:  Vision changes  HPI:  Catherine Newman is a 51 y.o. female here as a referral from Dr. Sherlean Foot for unspecified scotomas bilaterally. Has history of diabetes, migraines. 3 weeks ago on a Sunday night she went to a loud place and started feeling nauseated and tired and when she got home she fell asleep an hour later she felt violently ill and threw up all night. She noticed that sh ehad vision changes. It is bilateral. If she looks at me she can't see the left side of my face. She describes the left side as gray. It is not improving. Optometry could not find any problems but did not perform visual field testing. Diagnosed her with migraine aura. She was also having headaches. The headache is "inside" and all over and feels burning, it can be throbbing and pounding, she has light sensitivity and nausea and vomiting.  Smells trigger and can worsen her headaches. Her body also hurts a lot, a lot of aching. Vision changes are worse with stress or brightness. Not better since initiation, stable. She has never had this happen before. She has had auras with the migraines, a light flickering. She gets migraines at the end of her menstrual cycle every month. Ibuprofen helps with caffeine. Can last for a day. She has 1-2 days of migraines. Current Headache is positional worse when laying down. No hearing changes. Hurts to mover her eyes. Colors are not affected. She lost 50 pounds. Her body hurts, she has aching from head to toe and restless legs. Her left breast leaks heavily.   [employer] teacher.   Reviewed notes, labs and imaging from outside physicians, which showe:  Reviewed optometry notes. She was referred there by urgent care seen in March 99. Her symptoms were scattered scotomas both eyes are within a day's onset. She was nauseated and dizzy. She was diagnosed with migraines. Was able to correct her vision  11/16/2018. Negative for retinal detachment, negative to bitemporal hemianopsia, negative optic nerve swelling, negative glaucoma.,pupils equally round and reactive to light, extraocular movements intact  Review of Systems: Patient complains of symptoms per HPI as well as the following symptoms: weight loss, fatigue, loss of vision, headache, weakness, joint pain, cramps, insomnia, restless kegs, sleepiness. Pertinent negatives per HPI. All others negative.   Social History   Social History  . Marital status: Married    Spouse name: N/A  . Number of children: 1  . Years of education: Master's   Occupational History  . [employer]     Art teacher   Social History Main Topics  . Smoking status: Former Smoker    Quit date: 1998  . Smokeless tobacco: Never Used  . Alcohol use No     Comment: Quit 1994  . Drug use: No  . Sexual activity: Not on file     Comment: Married   Other Topics Concern  . Not on file   Social History Narrative   Lives at home w/ her husband and daughter   Right-handed   Caffeine: 16 oz    Family History  Problem Relation Age of Onset  . Leukemia Mother   . Leukemia Father   . Endometriosis Sister   . Stroke Maternal Grandmother   . Lung cancer Maternal Grandfather   . Cancer Paternal Aunt   . Vision loss Neg Hx     Past Medical History:  Diagnosis Date  .  Diabetes mellitus without complication ([place])   . Inappropriate lactation    Occasional L breast  . Migraine   . Miscarriage    x 6    Past Surgical History:  Procedure Laterality Date  . ECTOPIC PREGNANCY SURGERY    . KNEE ARTHROSCOPY Right   . SALPINGOOPHORECTOMY Right     Current Outpatient Prescriptions  Medication Sig Dispense Refill  . ibuprofen (ADVIL,MOTRIN) 400 MG tablet Take 400 mg by mouth every 6 (six) hours as needed for mild pain.    Marland Kitchen glipiZIDE (GLUCOTROL) 10 MG tablet Take 1 tablet (10 mg total) by mouth daily before breakfast. (Patient not taking: Reported on  01/06/2017) 30 tablet 3  . lisinopril (PRINIVIL,ZESTRIL) 5 MG tablet Take 1 tablet (5 mg total) by mouth daily. 30 tablet 5   No current facility-administered medications for this visit.     Allergies as of 01/06/2017  . (No Known Allergies)    Vitals: BP (!) 179/105   Ht _0  (1.549 m)   Wt 206 lb 9.6 oz (93.7 kg)   BMI 39.04 kg/m  Last Weight:  Wt Readings from Last 1 Encounters:  01/06/17 206 lb 9.6 oz (93.7 kg)   Last Height:   Ht Readings from Last 1 Encounters:  01/06/17 _1  (1.549 m)    Physical exam: Exam: Gen: NAD, conversant, well nourised, obese, well groomed                     CV: RRR, no MRG. No Carotid Bruits. No peripheral edema, warm, nontender Eyes: Conjunctivae clear without exudates or hemorrhage  Neuro: Detailed Neurologic Exam  Speech:    Speech is normal; fluent and spontaneous with normal comprehension.  Cognition:    The patient is oriented to person, place, and time;     recent and remote memory intact;     language fluent;     normal attention, concentration,     fund of knowledge Cranial Nerves:    The pupils are equal, round, and reactive to light. The fundi are normal and spontaneous venous pulsations are present. 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 Extraocular movements are intact. Trigeminal sensation is intact and the muscles of mastication are normal. The face is symmetric. The palate elevates in the midline. Hearing intact. Voice is normal. Shoulder shrug is normal. The tongue has normal motion without fasciculations.   Coordination:    Normal finger to nose and heel to shin. Normal rapid alternating movements.   Gait:    Heel-toe and tandem gait are normal.   Motor Observation:    No asymmetry, no atrophy, and no involuntary movements noted. Tone:    Normal muscle tone.    Posture:    Posture is normal. normal erect    Strength:    Strength is V/V in the upper and lower limbs.      Sensation:  intact to LT     Reflex Exam:  DTR's:    Deep tendon reflexes in the upper and lower extremities are normal bilaterally.   Toes:    The toes are downgoing bilaterally.   Clonus:    Clonus is absent.     Assessment/Plan:  51 year old female with persistent visual field defect, may be migrainous but need to rule out all other etiologies  Went to optometry but not ophthalmology and no visual fields were obtained: refer to opthamologist  Untreated HTN: restart Lisinopril, f/u with primary care  MRI brain  w/wo contrast to evaluate for stroke, other lesions or masses to cause vision loss and positional headaches  Labs: B12 and folate, HgbA1c, prolactin, methylmalonic acid, cbc, cmp, ANA, anca, esr, nmo, RF, B1  Possibly an LP if above is unrevealing  Discussed to prevent or relieve headaches, try the following: Cool Compress. Lie down and place a cool compress on your head.  Avoid headache triggers. If certain foods or odors seem to have triggered your migraines in the past, avoid them. A headache diary might help you identify triggers.  Include physical activity in your daily routine. Try a daily walk or other moderate aerobic exercise.  Manage stress. Find healthy ways to cope with the stressors, such as delegating tasks on your to-do list.  Practice relaxation techniques. Try deep breathing, yoga, massage and visualization.  Eat regularly. Eating regularly scheduled meals and maintaining a healthy diet might help prevent headaches. Also, drink plenty of fluids.  Follow a regular sleep schedule. Sleep deprivation might contribute to headaches Consider biofeedback. With this mind-body technique, you learn to control certain bodily functions - such as muscle tension, heart rate and blood pressure - to prevent headaches or reduce headache pain.    Proceed to emergency room if you experience new or worsening symptoms or symptoms do not resolve, if you have new neurologic symptoms or if  headache is severe, worsening vision, or for any concerning symptom.   Orders Placed This Encounter  Procedures  . MR BRAIN W WO CONTRAST  . Ambulatory referral to Ophthalmology    Cc: dr. Maryclare Labrador, MD  [facility] [address] [place],  [address]  Phone [phone] Fax [phone]

## 2017-01-09 ENCOUNTER — Encounter: Payer: Self-pay | Admitting: Neurology

## 2017-01-09 NOTE — Telephone Encounter (Signed)
Catherine Newman, I need to order the following labs but our lab was closed and she works in [employer] and can;t make it back to [place] [employer] for labs. Can we find someplace in [place] for her to go? She mentioned to the front desk that she wanted her husband to draw the labs, can you call her and see if he is at a lab we can fax the orders to?  Labs: B12 and folate, HgbA1c, prolactin, methylmalonic acid, cbc, cmp, ANA, anca, esr, nmo, RF, B1  Thanks

## 2017-01-11 NOTE — Telephone Encounter (Signed)
Called and spoke to pt. Reports that her husband works w/ [facility]. She will have him call back w/ a fax number so that lab orders can be sent.

## 2017-01-12 ENCOUNTER — Telehealth: Payer: Self-pay | Admitting: Neurology

## 2017-01-12 ENCOUNTER — Ambulatory Visit: Payer: BC Managed Care – PPO | Admitting: Neurology

## 2017-01-12 MED ORDER — ALPRAZOLAM 0.5 MG PO TABS: ORAL_TABLET | ORAL | 0 refills | Status: DC

## 2017-01-12 NOTE — Telephone Encounter (Signed)
Rx signed and faxed to pt's pharmacy.

## 2017-01-12 NOTE — Telephone Encounter (Signed)
Patient called office with fax number to fax labs. [phone].  Attention Chales Abrahams.

## 2017-01-12 NOTE — Telephone Encounter (Signed)
Patient is scheduled to have her MRI at our [facility] mobile unit Wednesday 01/19/17. She informed me that she is claustrophic and needs something to calm her nerves.

## 2017-01-12 NOTE — Addendum Note (Signed)
Addended by: Donnelly Angelica on: 01/12/2017 06:43 PM   Modules accepted: Orders

## 2017-01-12 NOTE — Telephone Encounter (Signed)
Xanax rx printed, awaiting MD review/signature. 

## 2017-01-12 NOTE — Telephone Encounter (Signed)
Lab orders entered and printed, awaiting MD signature.

## 2017-01-17 NOTE — Telephone Encounter (Signed)
Lab orders signed and faxed as requested.

## 2017-01-19 ENCOUNTER — Ambulatory Visit (INDEPENDENT_AMBULATORY_CARE_PROVIDER_SITE_OTHER): Payer: BC Managed Care – PPO

## 2017-01-19 DIAGNOSIS — H53461 Homonymous bilateral field defects, right side: Secondary | ICD-10-CM | POA: Diagnosis not present

## 2017-01-19 DIAGNOSIS — R51 Headache with orthostatic component, not elsewhere classified: Secondary | ICD-10-CM

## 2017-01-19 DIAGNOSIS — H547 Unspecified visual loss: Secondary | ICD-10-CM

## 2017-01-19 MED ORDER — GADOPENTETATE DIMEGLUMINE 469.01 MG/ML IV SOLN: 20.0000 mL | Freq: Once | INTRAVENOUS | Status: AC | PRN

## 2017-01-24 ENCOUNTER — Encounter: Payer: Self-pay | Admitting: Neurology

## 2017-01-25 ENCOUNTER — Telehealth: Payer: Self-pay

## 2017-01-25 ENCOUNTER — Telehealth: Payer: Self-pay | Admitting: Neurology

## 2017-01-25 ENCOUNTER — Other Ambulatory Visit: Payer: Self-pay | Admitting: Neurology

## 2017-01-25 MED ORDER — BACLOFEN 10 MG PO TABS: 10.0000 mg | ORAL_TABLET | Freq: Every evening | ORAL | 4 refills | Status: DC | PRN

## 2017-01-25 NOTE — Telephone Encounter (Signed)
Pt said that she will be available for a call between 12:00 and 12:30 and then from 1:05 to 1:50 she will be available again if at all possible please call within one of these times to discuss test results.  Pt also said she is available anytime after 3:30 please call

## 2017-01-25 NOTE — Telephone Encounter (Signed)
Pt called back this afternoon. She is very anxious to rec' a call from Dr Lucia Gaskins.

## 2017-01-25 NOTE — Telephone Encounter (Signed)
Received faxed consult notes from [facility]. Dr. Dione Booze reports, " Dilated today. 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 Agree w/ plan to check MRI, scheduled for tomorrow. Discussed dx w/ pt. Plan to check again in 4 months." Sent to med records for scanning, copy to Dr. Lucia Gaskins for review.

## 2017-01-26 NOTE — Telephone Encounter (Signed)
I discussed MRI findings with patient which showed an enhancing lesion likely affecting the optic radiations causing her vision loss. Very unclear what this lesion is, could be subacute stroke. I recommended follow-up MRI in 2-3 months and daily aspirin. If she experiences worsening symptoms she needs to call immediately for repeat imaging. We'll follow up next ups after MRI in 3 months.

## 2017-01-27 ENCOUNTER — Encounter: Payer: Self-pay | Admitting: Neurology

## 2017-02-02 ENCOUNTER — Ambulatory Visit: Payer: BC Managed Care – PPO | Admitting: Physician Assistant

## 2017-02-09 ENCOUNTER — Encounter: Payer: Self-pay | Admitting: Physician Assistant

## 2017-02-09 ENCOUNTER — Ambulatory Visit (INDEPENDENT_AMBULATORY_CARE_PROVIDER_SITE_OTHER): Payer: BC Managed Care – PPO | Admitting: Physician Assistant

## 2017-02-09 VITALS — BP 166/84 | HR 74 | Ht 62.0 in | Wt 206.0 lb

## 2017-02-09 DIAGNOSIS — R634 Abnormal weight loss: Secondary | ICD-10-CM

## 2017-02-09 DIAGNOSIS — I10 Essential (primary) hypertension: Secondary | ICD-10-CM

## 2017-02-09 DIAGNOSIS — H53461 Homonymous bilateral field defects, right side: Secondary | ICD-10-CM | POA: Diagnosis not present

## 2017-02-09 DIAGNOSIS — G8929 Other chronic pain: Secondary | ICD-10-CM

## 2017-02-09 DIAGNOSIS — M2550 Pain in unspecified joint: Secondary | ICD-10-CM | POA: Diagnosis not present

## 2017-02-09 DIAGNOSIS — M791 Myalgia, unspecified site: Secondary | ICD-10-CM

## 2017-02-09 DIAGNOSIS — E119 Type 2 diabetes mellitus without complications: Secondary | ICD-10-CM | POA: Diagnosis not present

## 2017-02-09 LAB — POCT GLYCOSYLATED HEMOGLOBIN (HGB A1C): Hemoglobin A1C: 11.9

## 2017-02-09 MED ORDER — CELECOXIB 200 MG PO CAPS: 200.0000 mg | ORAL_CAPSULE | Freq: Two times a day (BID) | ORAL | 2 refills | Status: DC

## 2017-02-09 MED ORDER — DULAGLUTIDE 0.75 MG/0.5ML ~~LOC~~ SOAJ: SUBCUTANEOUS | 2 refills | Status: DC

## 2017-02-09 MED ORDER — HYDROCODONE-ACETAMINOPHEN 5-325 MG PO TABS: 1.0000 | ORAL_TABLET | Freq: Three times a day (TID) | ORAL | 0 refills | Status: DC | PRN

## 2017-02-09 NOTE — Patient Instructions (Addendum)
Stay on ASA.  Consider lipitor.  Added trulcity.  Consider celebrex.  Consider cymbalta.

## 2017-02-09 NOTE — Progress Notes (Signed)
Subjective:    Patient ID: Catherine Newman, female    DOB: 1966/04/16, 51 y.o.   MRN: [id number]  HPI Pt is a 51 yo female who presents to the clinic to establish care.   She has a PMH of DM and HTN but not been on medication for years.   .. Family History  Problem Relation Age of Onset  . Leukemia Mother   . Leukemia Father   . Endometriosis Sister   . Stroke Maternal Grandmother   . Lung cancer Maternal Grandfather   . Cancer Paternal Aunt   . Diabetes Maternal Aunt   . Diabetes Maternal Uncle   . Vision loss Neg Hx    .Marland Kitchen. Social History   Social History  . Marital status: Married    Spouse name: N/A  . Number of children: 1  . Years of education: Master's   Occupational History  . [employer]     Art teacher   Social History Main Topics  . Smoking status: Former Smoker    Quit date: 1998  . Smokeless tobacco: Never Used  . Alcohol use No     Comment: Quit 1994  . Drug use: No  . Sexual activity: Yes    Partners: Male     Comment: Married   Other Topics Concern  . Not on file   Social History Narrative   Lives at home w/ her husband and daughter   Right-handed   Caffeine: 16 oz   Pt comes in because she "knows she needs to start taking care of herself". She has not been seen in a while. She is occasionally checking her sugars and ranging in 200's. 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 She is seeing [facility] for management. They noticed elevated BP and started on lisinopril 5mg .   She feels like something is wrong with her that she has never been diagnosed with. She had 8 miscarriages, always struggled with weight, she has headache/migraines, hx of left breast discharge, diabetes.   She has lost 50lbs in the last year. She has been more active but no known reason for weight loss.   She also complains of overall whole body pain in  episodes. She has tried lyrica/gabapentin but cannot tolerate due to feeling very loopy. She really wants something for pain that she can take just on bad flares to sleep.     Review of Systems  All other systems reviewed and are negative.      Objective:   Physical Exam  Constitutional: She is oriented to person, place, and time. She appears well-developed and well-nourished.  HENT:  Head: Normocephalic and atraumatic.  Cardiovascular: Normal rate, regular rhythm and normal heart sounds.   Pulmonary/Chest: Effort normal and breath sounds normal.  Neurological: She is alert and oriented to person, place, and time.  Psychiatric: She has a normal mood and affect. Her behavior is normal.          Assessment & Plan:  .Marland Kitchen.Catherine Newman was seen today for establish care, hypertension and diabetes.  Diagnoses and all orders for this visit:  Type 2 diabetes mellitus without complication, without long-term current use of insulin (HCC) -     POCT HgB A1C -     Dulaglutide (TRULICITY) 0.75 MG/0.5ML SOPN; One injection [place] every week.  Chronic pain of multiple joints  Myalgia -  celecoxib (CELEBREX) 200 MG capsule; Take 1 capsule (200 mg total) by mouth 2 (two) times daily. -     HYDROcodone-acetaminophen (NORCO/VICODIN) 5-325 MG tablet; Take 1 tablet by mouth every 8 (eight) hours as needed for moderate pain. For acute pain.  Right homonymous hemianopsia  Essential hypertension -     lisinopril (PRINIVIL,ZESTRIL) 10 MG tablet; Take 1 tablet (10 mg total) by mouth daily.  Weight loss   Discussed we are going to take it step by step.  First thing is to control BP and diabetes.   .. Lab Results  Component Value Date   HGBA1C 11.9 02/09/2017   Started trulicity weekly. Discussed proper diet to control sugar.  She would only go on one diabetic medication at a time.  On ACE.  On ASA Follow up in 3 months.   Elevated cholesterol- pt declined medication therapy. Risk vs benefits  discussed. Will start with lifestyle management.   HTN- increased lisinopril to 10mg  daily.   norco small quanity for only as needed. [place] controlled substance database reviewed without concerns.  Discussed celebrex and side effects will start. GI side effects discuss and to stop or call office with them.  Consider cymbalta. Pt not willing to start at this time.

## 2017-02-13 DIAGNOSIS — M791 Myalgia, unspecified site: Secondary | ICD-10-CM | POA: Insufficient documentation

## 2017-02-13 DIAGNOSIS — M2550 Pain in unspecified joint: Secondary | ICD-10-CM

## 2017-02-13 DIAGNOSIS — E119 Type 2 diabetes mellitus without complications: Secondary | ICD-10-CM | POA: Insufficient documentation

## 2017-02-13 DIAGNOSIS — G8929 Other chronic pain: Secondary | ICD-10-CM | POA: Insufficient documentation

## 2017-02-14 ENCOUNTER — Encounter: Payer: Self-pay | Admitting: Physician Assistant

## 2017-02-14 DIAGNOSIS — R634 Abnormal weight loss: Secondary | ICD-10-CM | POA: Insufficient documentation

## 2017-02-14 DIAGNOSIS — H53461 Homonymous bilateral field defects, right side: Secondary | ICD-10-CM | POA: Insufficient documentation

## 2017-02-14 DIAGNOSIS — I10 Essential (primary) hypertension: Secondary | ICD-10-CM | POA: Insufficient documentation

## 2017-02-14 MED ORDER — LISINOPRIL 10 MG PO TABS: 10.0000 mg | ORAL_TABLET | Freq: Every day | ORAL | 1 refills | Status: DC

## 2017-03-07 IMAGING — DX DG CHEST 2V
2 series · 2 of 2 positions shown · non-contrast
Comparison: None.

CLINICAL DATA: Chest pain x 1 day

EXAM:
CHEST  2 VIEW

[chest pa]
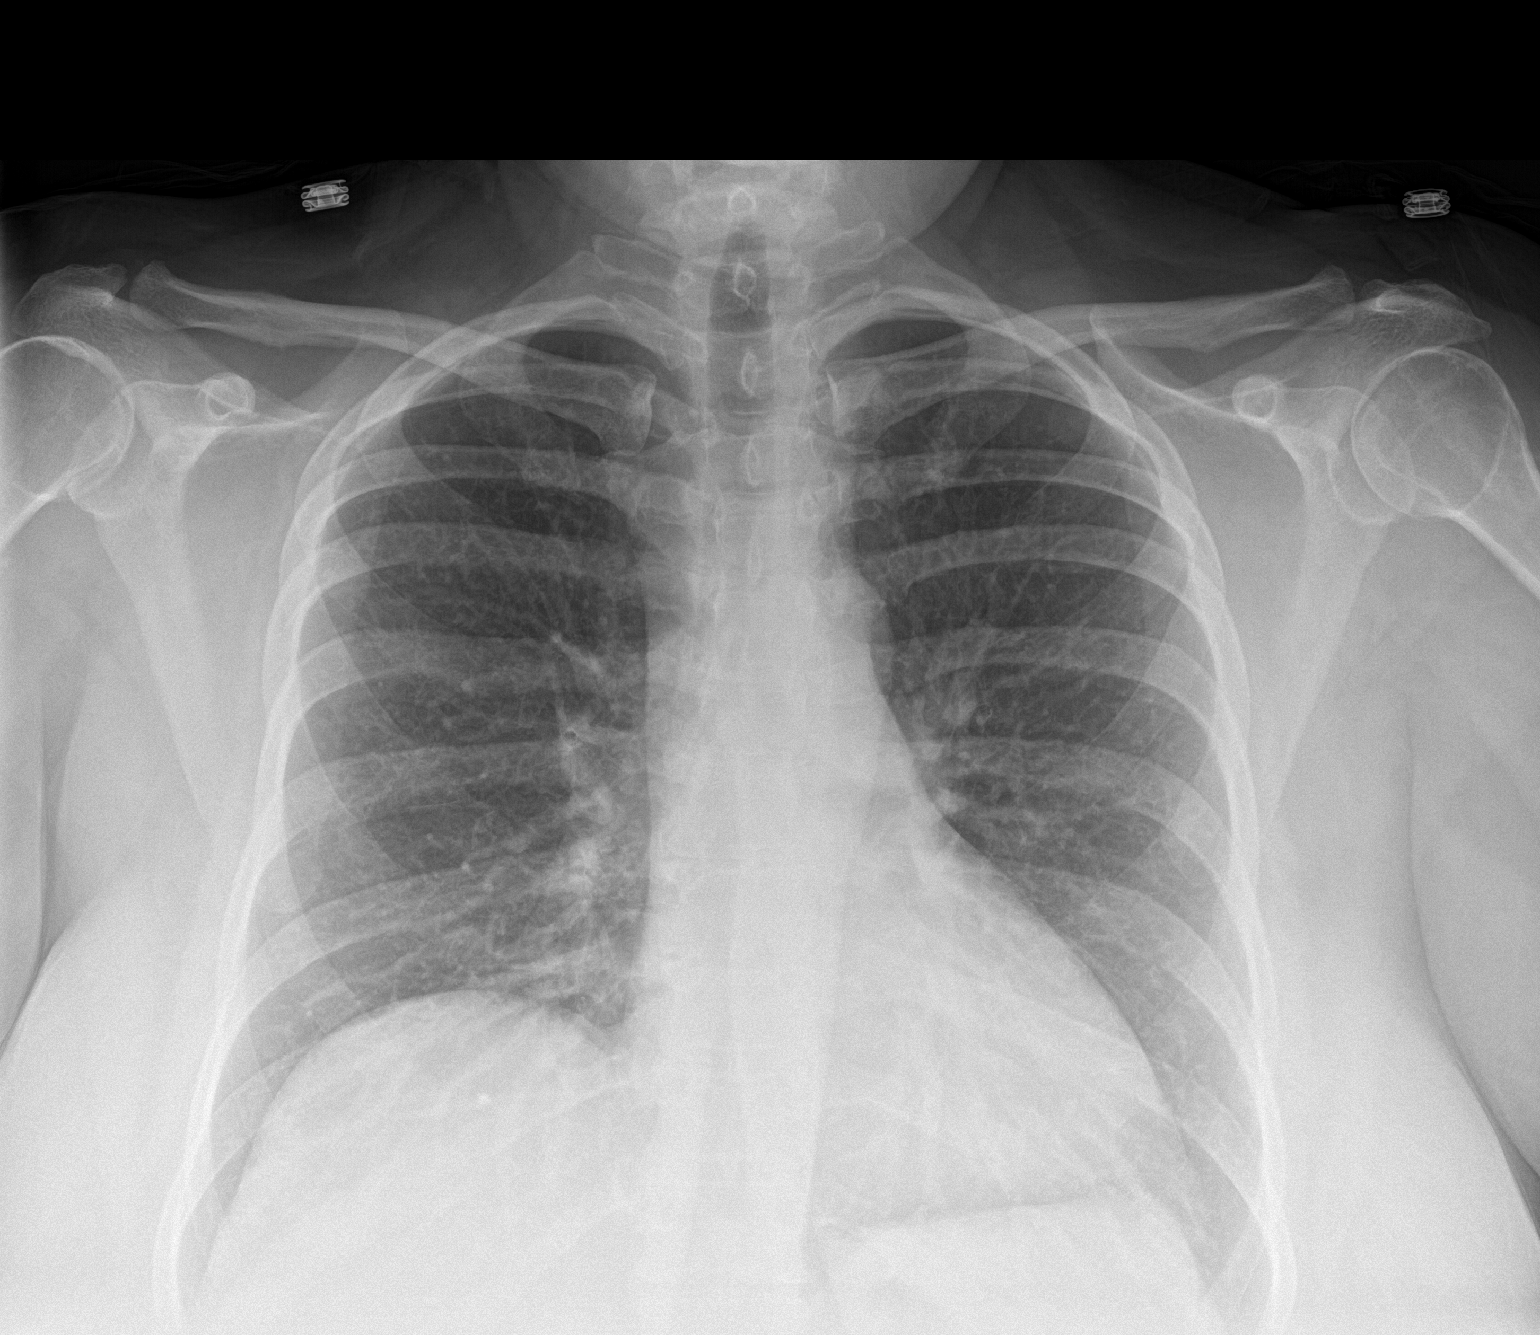

[chest lat]
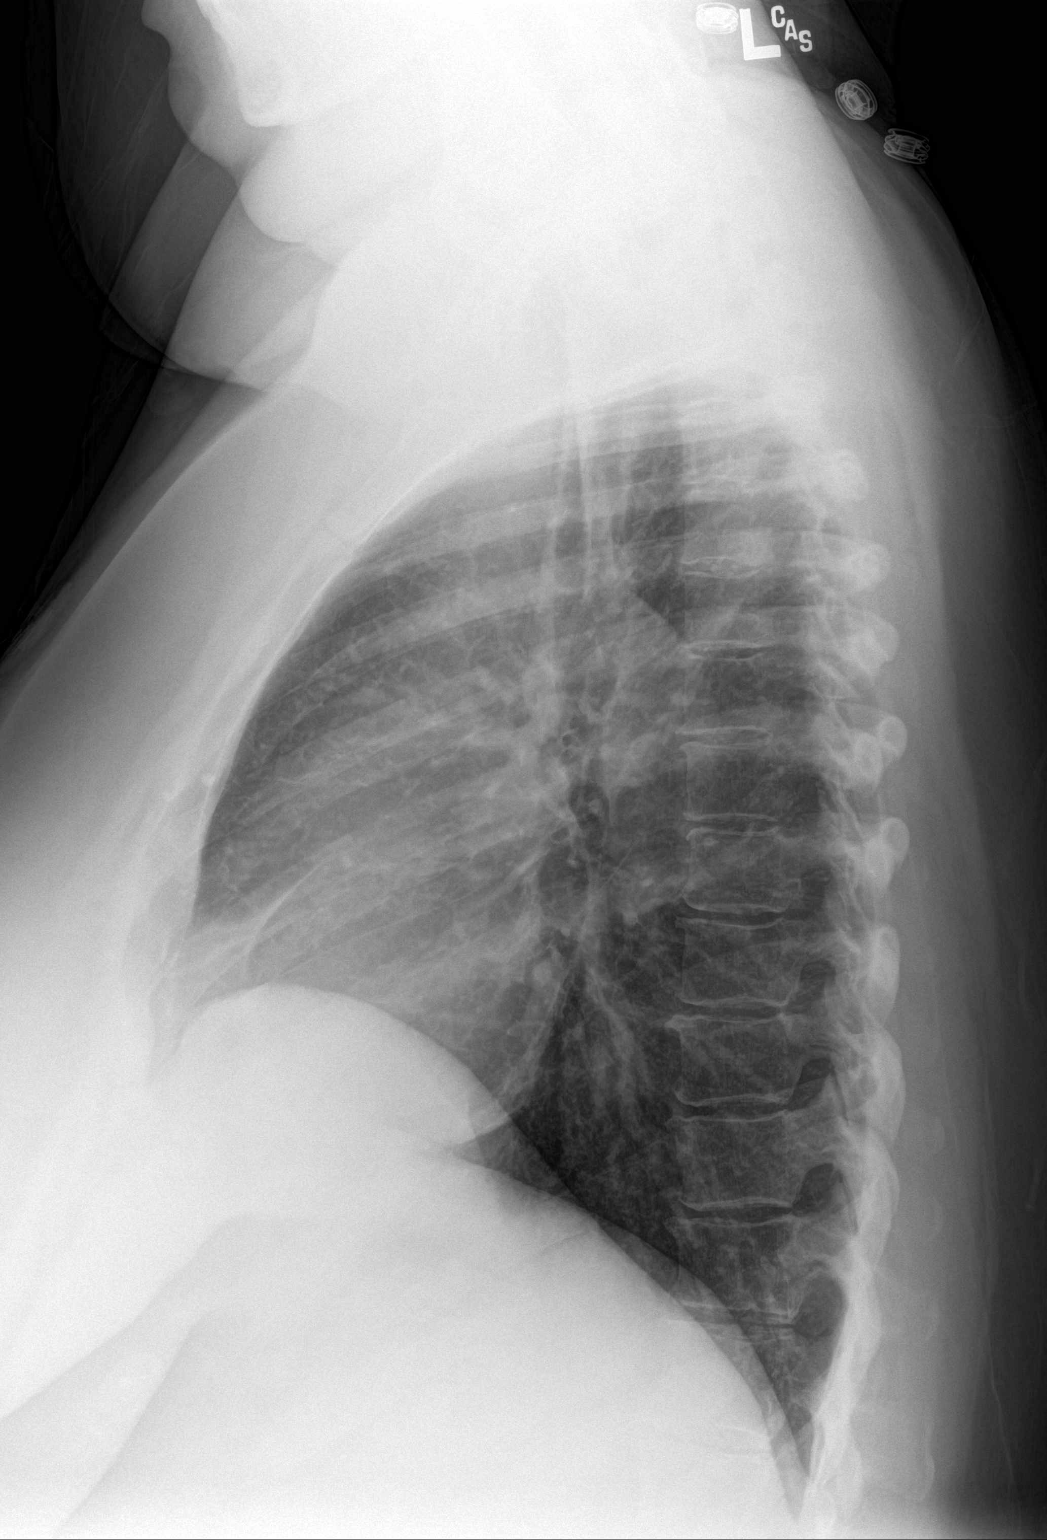

[2 of 2 positions shown; findings below may reference images not displayed]

FINDINGS: The heart size and mediastinal contours are within normal limits.
Both lungs are clear. The visualized skeletal structures are
unremarkable.
IMPRESSION: No active cardiopulmonary disease.

## 2017-03-14 ENCOUNTER — Telehealth: Payer: Self-pay | Admitting: *Deleted

## 2017-03-14 NOTE — Telephone Encounter (Signed)
Did she not having any until yesterday? If that is the case I do not think trulicity. Likely she has some short of GI bug. Hold trulicity for 1 week. BRAT diet. If not improving or if symptoms worsen call back in 24 to 48 hours.

## 2017-03-14 NOTE — Telephone Encounter (Signed)
Patient notified and voiced understanding.

## 2017-03-14 NOTE — Telephone Encounter (Signed)
Patient left a message stating she has been on Trulicity going on the third week. She states yesterday she started having vomiting and nausea and belching that persisted until today. She wants to know what she should do. Please advise.

## 2017-03-16 ENCOUNTER — Ambulatory Visit: Payer: BC Managed Care – PPO | Admitting: Neurology

## 2017-04-18 ENCOUNTER — Ambulatory Visit: Payer: BC Managed Care – PPO | Admitting: Neurology

## 2017-04-18 ENCOUNTER — Telehealth: Payer: Self-pay

## 2017-04-18 NOTE — Telephone Encounter (Signed)
Pt called and cancelled same day appt 

## 2017-04-19 ENCOUNTER — Encounter: Payer: Self-pay | Admitting: Neurology

## 2017-04-26 ENCOUNTER — Telehealth: Payer: Self-pay | Admitting: Neurology

## 2017-04-26 NOTE — Telephone Encounter (Signed)
Catherine Newman, patient did not come to her last appointment. She needs repeat MRi brain to follow the lesion we saw there last time. Would you call her and let know and see if she is willing to go forward with this? thanks

## 2017-04-27 NOTE — Telephone Encounter (Signed)
Called and LVM for pt to call back. Relayed AA,MD message. Wanted to know if she is agreeable to have repeat MRI brain. Gave [facility] phone number.

## 2017-04-27 NOTE — Telephone Encounter (Signed)
Dr Ahern- FYI 

## 2017-04-27 NOTE — Telephone Encounter (Signed)
Patient calling back stating she would like repeat MRI but she has a high balance with our office. She wanted me to send call to billing to discuss payment before scheduling repeat MRI and she will call Dr. Lucia GaskinsAhern after speaking to billing.  I sent call to Angie.

## 2017-05-08 ENCOUNTER — Other Ambulatory Visit: Payer: Self-pay | Admitting: Physician Assistant

## 2017-05-08 DIAGNOSIS — I10 Essential (primary) hypertension: Secondary | ICD-10-CM

## 2017-05-26 ENCOUNTER — Encounter: Payer: Self-pay | Admitting: Neurology

## 2017-05-29 ENCOUNTER — Other Ambulatory Visit: Payer: Self-pay | Admitting: Neurology

## 2017-05-29 DIAGNOSIS — G939 Disorder of brain, unspecified: Secondary | ICD-10-CM

## 2017-05-29 DIAGNOSIS — I639 Cerebral infarction, unspecified: Secondary | ICD-10-CM

## 2017-05-29 DIAGNOSIS — H543 Unqualified visual loss, both eyes: Secondary | ICD-10-CM

## 2017-05-31 ENCOUNTER — Telehealth: Payer: Self-pay | Admitting: Neurology

## 2017-05-31 MED ORDER — ALPRAZOLAM 0.5 MG PO TABS: ORAL_TABLET | ORAL | 0 refills | Status: DC

## 2017-05-31 NOTE — Telephone Encounter (Signed)
Received verbal order from Dr. Lucia GaskinsAhern for pt's xanax prior to MRI. Will print RX and give to Dr. Lucia GaskinsAhern for signature.

## 2017-05-31 NOTE — Telephone Encounter (Signed)
Patient is scheduled to have her MRI done on 06/08/17 at our [facility] unit. She did informed me that she is claustrophic and will need something to calm her.

## 2017-05-31 NOTE — Telephone Encounter (Signed)
I called pt. I explained the xanax RX to her and that she will need a driver to and from her MRI appt if she plans on using the xanax. Pt is agreeable to a driver and understands the instructions. Pt asked that the RX [facility] to [facility] on [address]. Received a receipt of confirmation.

## 2017-06-08 ENCOUNTER — Ambulatory Visit (INDEPENDENT_AMBULATORY_CARE_PROVIDER_SITE_OTHER): Payer: BC Managed Care – PPO

## 2017-06-08 DIAGNOSIS — G939 Disorder of brain, unspecified: Secondary | ICD-10-CM | POA: Diagnosis not present

## 2017-06-08 DIAGNOSIS — H543 Unqualified visual loss, both eyes: Secondary | ICD-10-CM

## 2017-06-08 DIAGNOSIS — I639 Cerebral infarction, unspecified: Secondary | ICD-10-CM | POA: Diagnosis not present

## 2017-06-08 MED ORDER — GADOPENTETATE DIMEGLUMINE 469.01 MG/ML IV SOLN: 20.0000 mL | Freq: Once | INTRAVENOUS | Status: AC | PRN

## 2017-06-13 ENCOUNTER — Telehealth: Payer: Self-pay | Admitting: *Deleted

## 2017-06-13 NOTE — Telephone Encounter (Signed)
Called and spoke with patient about MRI results per AA,MD note. Patient verbalized understanding and appreciation for call.

## 2017-06-13 NOTE — Telephone Encounter (Signed)
-----   Message from Anson Fret, MD sent at 06/13/2017  4:52 PM EDT ----- MRI of the brain is improved The lesion we saw on last MRI is better, it is not enhancing. MRI should be repeated in one year thanks

## 2017-08-15 ENCOUNTER — Other Ambulatory Visit: Payer: Self-pay | Admitting: Physician Assistant

## 2017-08-15 ENCOUNTER — Other Ambulatory Visit: Payer: Self-pay | Admitting: *Deleted

## 2017-08-15 ENCOUNTER — Other Ambulatory Visit: Payer: Self-pay | Admitting: Neurology

## 2017-08-15 DIAGNOSIS — I10 Essential (primary) hypertension: Secondary | ICD-10-CM

## 2017-08-15 MED ORDER — LISINOPRIL 10 MG PO TABS: 10.0000 mg | ORAL_TABLET | Freq: Every day | ORAL | 0 refills | Status: DC

## 2017-09-16 ENCOUNTER — Other Ambulatory Visit: Payer: Self-pay | Admitting: Neurology

## 2017-09-16 ENCOUNTER — Other Ambulatory Visit: Payer: Self-pay | Admitting: Physician Assistant

## 2017-09-16 DIAGNOSIS — I10 Essential (primary) hypertension: Secondary | ICD-10-CM

## 2017-12-15 ENCOUNTER — Other Ambulatory Visit: Payer: Self-pay

## 2017-12-15 MED ORDER — BACLOFEN 10 MG PO TABS: 10.0000 mg | ORAL_TABLET | Freq: Every evening | ORAL | 0 refills | Status: DC | PRN

## 2017-12-20 ENCOUNTER — Other Ambulatory Visit: Payer: Self-pay | Admitting: Neurology

## 2017-12-27 ENCOUNTER — Telehealth: Payer: Self-pay | Admitting: *Deleted

## 2017-12-27 MED ORDER — BACLOFEN 10 MG PO TABS: 10.0000 mg | ORAL_TABLET | Freq: Every evening | ORAL | 0 refills | Status: DC | PRN

## 2017-12-27 NOTE — Telephone Encounter (Signed)
Received a refill request for Baclofen from [facility] [address]. [facility]. RN called [facility] and was told that the prescription was on hold and the patient never picked it up. RN had [facility] pharmacy associate cancel the prescription for Baclofen. She verbalized understanding.   RN e-escribed the [facility]ill to [facility] [address]. Included notification that appt is needed for further refills.

## 2018-08-21 ENCOUNTER — Encounter (HOSPITAL_COMMUNITY): Admission: RE | Admit: 2018-08-21 | Payer: BC Managed Care – PPO | Source: Ambulatory Visit

## 2018-08-21 NOTE — Patient Instructions (Addendum)
Catherine Newman  08/21/2018   Your procedure is scheduled on: Monday 08/28/2018  Report to [facility] Main  Entrance              Report to admitting at  0905 AM   Call this number if you have problems the morning of surgery [phone]    How to Manage Your Diabetes Before and After Surgery  Why is it important to control my blood sugar before and after surgery? . Improving blood sugar levels before and after surgery helps healing and can limit problems. . A way of improving blood sugar control is eating a healthy diet by: o  Eating less sugar and carbohydrates o  Increasing activity/exercise o  Talking with your doctor about reaching your blood sugar goals . High blood sugars (greater than 180 mg/dL) can raise your risk of infections and slow your recovery, so you will need to focus on controlling your diabetes during the weeks before surgery. . Make sure that the doctor who takes care of your diabetes knows about your planned surgery including the date and location.  How do I manage my blood sugar before surgery? . Check your blood sugar at least 4 times a day, starting 2 days before surgery, to make sure that the level is not too high or low. o Check your blood sugar the morning of your surgery when you wake up and every 2 hours until you get to the Short Stay unit. . If your blood sugar is less than 70 mg/dL, you will need to treat for low blood sugar: o Do not take insulin. o Treat a low blood sugar (less than 70 mg/dL) with  cup of clear juice (cranberry or apple), 4 glucose tablets, OR glucose gel. o Recheck blood sugar in 15 minutes after treatment (to make sure it is greater than 70 mg/dL). If your blood sugar is not greater than 70 mg/dL on recheck, call [phone] for further instructions. . Report your blood sugar to the short stay nurse when you get to Short Stay.  . If you are admitted to the hospital after surgery: o Your blood sugar will be  checked by the staff and you will probably be given insulin after surgery (instead of oral diabetes medicines) to make sure you have good blood sugar levels. o The goal for blood sugar control after surgery is 80-180 mg/dL.   WHAT DO I DO ABOUT MY DIABETES MEDICATION?  Marland Kitchen Do not take oral diabetes medicines (pills) the morning of surgery.  . THE NIGHT BEFORE SURGERY, take  10  units of   Lantus    insulin.            The morning of surgery, Take  10 units of Lantus insulin   Follow instructions below for Novalog  kwikpen for morning of surgery only! . If your CBG is greater than 220 mg/dL, you may take  of your sliding scale  . (correction) dose of insulin.      Remember: Do not eat food or drink liquids :After Midnight.              BRUSH YOUR TEETH MORNING OF SURGERY AND RINSE YOUR MOUTH OUT, NO CHEWING GUM CANDY OR MINTS.     Take these medicines the morning of surgery with A SIP OF WATER: Levothyroxine (Synthroid)  DO NOT TAKE ANY DIABETIC MEDICATIONS DAY OF YOUR SURGERY                               You may not have any metal on your body including hair pins and              piercings  Do not wear jewelry, make-up, lotions, powders or perfumes, deodorant             Do not wear nail polish.  Do not shave  48 hours prior to surgery.               Do not bring valuables to the hospital. [facility] IS NOT             RESPONSIBLE   FOR VALUABLES.  Contacts, dentures or bridgework may not be worn into surgery.  Leave suitcase in the car. After surgery it may be brought to your room.                  Please read over the following fact sheets you were given: _____________________________________________________________________             [facility] - Preparing for Surgery Before surgery, you can play an important role.  Because skin is not sterile, your skin needs to be as free of germs as possible.  You can reduce the number of germs on your skin by  washing with CHG (chlorahexidine gluconate) soap before surgery.  CHG is an antiseptic cleaner which kills germs and bonds with the skin to continue killing germs even after washing. Please DO NOT use if you have an allergy to CHG or antibacterial soaps.  If your skin becomes reddened/irritated stop using the CHG and inform your nurse when you arrive at Short Stay. Do not shave (including legs and underarms) for at least 48 hours prior to the first CHG shower.  You may shave your face/neck. Please follow these instructions carefully:  1.  Shower with CHG Soap the night before surgery and the  morning of Surgery.  2.  If you choose to wash your hair, wash your hair first as usual with your  normal  shampoo.  3.  After you shampoo, rinse your hair and body thoroughly to remove the  shampoo.                           4.  Use CHG as you would any other liquid soap.  You can apply chg directly  to the skin and wash                       Gently with a scrungie or clean washcloth.  5.  Apply the CHG Soap to your body ONLY FROM THE NECK DOWN.   Do not use on face/ open                           Wound or open sores. Avoid contact with eyes, ears mouth and genitals (private parts).                       Wash face,  Genitals (private parts) with your normal soap.             6.  Wash thoroughly, paying special attention to the area  where your surgery  will be performed.  7.  Thoroughly rinse your body with warm water from the neck down.  8.  DO NOT shower/wash with your normal soap after using and rinsing off  the CHG Soap.                9.  Pat yourself dry with a clean towel.            10.  Wear clean pajamas.            11.  Place clean sheets on your bed the night of your first shower and do not  sleep with pets. Day of Surgery : Do not apply any lotions/deodorants the morning of surgery.  Please wear clean clothes to the hospital/surgery center.  FAILURE TO FOLLOW THESE INSTRUCTIONS MAY RESULT IN THE  CANCELLATION OF YOUR SURGERY PATIENT SIGNATURE_________________________________  NURSE SIGNATURE__________________________________  ________________________________________________________________________   Rogelia MireIncentive Spirometer  An incentive spirometer is a tool that can help keep your lungs clear and active. This tool measures how well you are filling your lungs with each breath. Taking long deep breaths may help reverse or decrease the chance of developing breathing (pulmonary) problems (especially infection) following:  A long period of time when you are unable to move or be active. BEFORE THE PROCEDURE   If the spirometer includes an indicator to show your best effort, your nurse or respiratory therapist will set it to a desired goal.  If possible, sit up straight or lean slightly forward. Try not to slouch.  Hold the incentive spirometer in an upright position. INSTRUCTIONS FOR USE  1. Sit on the edge of your bed if possible, or sit up as far as you can in bed or on a chair. 2. Hold the incentive spirometer in an upright position. 3. Breathe out normally. 4. Place the mouthpiece in your mouth and seal your lips tightly around it. 5. Breathe in slowly and as deeply as possible, raising the piston or the ball toward the top of the column. 6. Hold your breath for 3-5 seconds or for as long as possible. Allow the piston or ball to fall to the bottom of the column. 7. Remove the mouthpiece from your mouth and breathe out normally. 8. Rest for a few seconds and repeat Steps 1 through 7 at least 10 times every 1-2 hours when you are awake. Take your time and take a few normal breaths between deep breaths. 9. The spirometer may include an indicator to show your best effort. Use the indicator as a goal to work toward during each repetition. 10. After each set of 10 deep breaths, practice coughing to be sure your lungs are clear. If you have an incision (the cut made at the time of surgery),  support your incision when coughing by placing a pillow or rolled up towels firmly against it. Once you are able to get out of bed, walk around indoors and cough well. You may stop using the incentive spirometer when instructed by your caregiver.  RISKS AND COMPLICATIONS  Take your time so you do not get dizzy or light-headed.  If you are in pain, you may need to take or ask for pain medication before doing incentive spirometry. It is harder to take a deep breath if you are having pain. AFTER USE  Rest and breathe slowly and easily.  It can be helpful to keep track of a log of your progress. Your caregiver can provide you with a simple  table to help with this. If you are using the spirometer at home, follow these instructions: SEEK MEDICAL CARE IF:   You are having difficultly using the spirometer.  You have trouble using the spirometer as often as instructed.  Your pain medication is not giving enough relief while using the spirometer.  You develop fever of 100.5 F (38.1 C) or higher. SEEK IMMEDIATE MEDICAL CARE IF:   You cough up bloody sputum that had not been present before.  You develop fever of 102 F (38.9 C) or greater.  You develop worsening pain at or near the incision site. MAKE SURE YOU:   Understand these instructions.  Will watch your condition.  Will get help right away if you are not doing well or get worse. Document Released: 01/24/2007 Document Revised: 12/06/2011 Document Reviewed: 03/27/2007 Exit[employer] Patient Information 2014 ExitCare, [place].   ________________________________________________________________________  WHAT IS A BLOOD TRANSFUSION? Blood Transfusion Information  A transfusion is the replacement of blood or some of its parts. Blood is made up of multiple cells which provide different functions.  Red blood cells carry oxygen and are used for blood loss replacement.  White blood cells fight against infection.  Platelets control  bleeding.  Plasma helps clot blood.  Other blood products are available for specialized needs, such as hemophilia or other clotting disorders. BEFORE THE TRANSFUSION  Who gives blood for transfusions?   Healthy volunteers who are fully evaluated to make sure their blood is safe. This is blood bank blood. Transfusion therapy is the safest it has ever been in the practice of medicine. Before blood is taken from a donor, a complete history is taken to make sure that person has no history of diseases nor engages in risky social behavior (examples are intravenous drug use or sexual activity with multiple partners). The donor's travel history is screened to minimize risk of transmitting infections, such as malaria. The donated blood is tested for signs of infectious diseases, such as HIV and hepatitis. The blood is then tested to be sure it is compatible with you in order to minimize the chance of a transfusion reaction. If you or a relative donates blood, this is often done in anticipation of surgery and is not appropriate for emergency situations. It takes many days to process the donated blood. RISKS AND COMPLICATIONS Although transfusion therapy is very safe and saves many lives, the main dangers of transfusion include:   Getting an infectious disease.  Developing a transfusion reaction. This is an allergic reaction to something in the blood you were given. Every precaution is taken to prevent this. The decision to have a blood transfusion has been considered carefully by your caregiver before blood is given. Blood is not given unless the benefits outweigh the risks. AFTER THE TRANSFUSION  Right after receiving a blood transfusion, you will usually feel much better and more energetic. This is especially true if your red blood cells have gotten low (anemic). The transfusion raises the level of the red blood cells which carry oxygen, and this usually causes an energy increase.  The nurse  administering the transfusion will monitor you carefully for complications. HOME CARE INSTRUCTIONS  No special instructions are needed after a transfusion. You may find your energy is better. Speak with your caregiver about any limitations on activity for underlying diseases you may have. SEEK MEDICAL CARE IF:   Your condition is not improving after your transfusion.  You develop redness or irritation at the intravenous (IV) site. SEEK IMMEDIATE MEDICAL  CARE IF:  Any of the following symptoms occur over the next 12 hours:  Shaking chills.  You have a temperature by mouth above 102 F (38.9 C), not controlled by medicine.  Chest, back, or muscle pain.  People around you feel you are not acting correctly or are confused.  Shortness of breath or difficulty breathing.  Dizziness and fainting.  You get a rash or develop hives.  You have a decrease in urine output.  Your urine turns a dark color or changes to pink, red, or brown. Any of the following symptoms occur over the next 10 days:  You have a temperature by mouth above 102 F (38.9 C), not controlled by medicine.  Shortness of breath.  Weakness after normal activity.  The white part of the eye turns yellow (jaundice).  You have a decrease in the amount of urine or are urinating less often.  Your urine turns a dark color or changes to pink, red, or brown. Document Released: 09/10/2000 Document Revised: 12/06/2011 Document Reviewed: 04/29/2008 [facility] Patient Information 2014 [place], [place].  _______________________________________________________________________

## 2018-08-22 ENCOUNTER — Encounter (HOSPITAL_COMMUNITY)
Admission: RE | Admit: 2018-08-22 | Discharge: 2018-08-22 | Disposition: A | Discharge status: Home / Self Care | Payer: No Typology Code available for payment source | Source: Ambulatory Visit | Attending: Orthopedic Surgery | Admitting: Orthopedic Surgery

## 2018-08-22 ENCOUNTER — Encounter (HOSPITAL_COMMUNITY): Payer: Self-pay

## 2018-08-22 ENCOUNTER — Other Ambulatory Visit: Payer: Self-pay

## 2018-08-22 DIAGNOSIS — E119 Type 2 diabetes mellitus without complications: Secondary | ICD-10-CM | POA: Diagnosis not present

## 2018-08-22 DIAGNOSIS — Z01812 Encounter for preprocedural laboratory examination: Secondary | ICD-10-CM | POA: Diagnosis present

## 2018-08-22 HISTORY — DX: Nausea with vomiting, unspecified: R11.2

## 2018-08-22 HISTORY — DX: Other complications of anesthesia, initial encounter: T88.59XA

## 2018-08-22 HISTORY — DX: Adverse effect of unspecified anesthetic, initial encounter: T41.45XA

## 2018-08-22 HISTORY — DX: Other specified postprocedural states: Z98.890

## 2018-08-22 LAB — COMPREHENSIVE METABOLIC PANEL
ALBUMIN: 3.8 g/dL (ref 3.5–5.0)
ALK PHOS: 60 U/L (ref 38–126)
ALT: 13 U/L (ref 0–44)
AST: 19 U/L (ref 15–41)
Anion gap: 8 (ref 5–15)
BUN: 17 mg/dL (ref 6–20)
CALCIUM: 8.8 mg/dL — AB (ref 8.9–10.3)
CHLORIDE: 104 mmol/L (ref 98–111)
CO2: 26 mmol/L (ref 22–32)
CREATININE: 0.38 mg/dL — AB (ref 0.44–1.00)
GFR calc non Af Amer: >60 mL/min (ref >60)
GLUCOSE: 174 mg/dL — AB (ref 70–99)
Potassium: 4.3 mmol/L (ref 3.5–5.1)
SODIUM: 138 mmol/L (ref 135–145)
Total Bilirubin: 0.5 mg/dL (ref 0.3–1.2)
Total Protein: 7.1 g/dL (ref 6.5–8.1)

## 2018-08-22 LAB — PROTIME-INR
INR: 0.91
Prothrombin Time: 12.2 seconds (ref 11.4–15.2)

## 2018-08-22 LAB — CBC
HCT: 45 % (ref 36.0–46.0)
Hemoglobin: 14.1 g/dL (ref 12.0–15.0)
MCH: 28.1 pg (ref 26.0–34.0)
MCHC: 31.3 g/dL (ref 30.0–36.0)
MCV: 89.8 fL (ref 80.0–100.0)
NRBC: 0 % (ref 0.0–0.2)
PLATELETS: 190 10*3/uL (ref 150–400)
RBC: 5.01 MIL/uL (ref 3.87–5.11)
RDW: 13.4 % (ref 11.5–15.5)
WBC: 6.3 10*3/uL (ref 4.0–10.5)

## 2018-08-22 LAB — HEMOGLOBIN A1C
Hgb A1c MFr Bld: 10.1 % — ABNORMAL HIGH (ref 4.8–5.6)
Mean Plasma Glucose: 243.17 mg/dL

## 2018-08-22 LAB — SURGICAL PCR SCREEN
MRSA, PCR: NEGATIVE
Staphylococcus aureus: NEGATIVE

## 2018-08-22 LAB — APTT: APTT: 28 s (ref 24–36)

## 2018-08-22 LAB — GLUCOSE, CAPILLARY: GLUCOSE-CAPILLARY: 172 mg/dL — AB (ref 70–99)

## 2018-08-22 LAB — ABO/RH: ABO/RH(D): O POS

## 2018-08-22 NOTE — Progress Notes (Signed)
08/21/2018- on chart from Dr. Dorette GrateLisa Corum -EKG

## 2018-08-22 NOTE — Progress Notes (Signed)
08/01/2018- office note on chart from Dr. Dorette GrateLisa Corum  07/12/2018- POC glucose on chart from Dr. Judee Claraorum

## 2018-08-28 ENCOUNTER — Encounter (HOSPITAL_COMMUNITY): Admission: RE | Payer: Self-pay | Source: Ambulatory Visit

## 2018-08-28 ENCOUNTER — Inpatient Hospital Stay (HOSPITAL_COMMUNITY)
Admission: RE | Admit: 2018-08-28 | Payer: BC Managed Care – PPO | Source: Ambulatory Visit | Admitting: Orthopedic Surgery

## 2018-08-28 LAB — TYPE AND SCREEN
ABO/RH(D): O POS
Antibody Screen: NEGATIVE

## 2018-08-28 SURGERY — ARTHROPLASTY, KNEE, TOTAL
Anesthesia: Choice | Site: Knee | Laterality: Right

## 2019-04-28 DIAGNOSIS — J189 Pneumonia, unspecified organism: Secondary | ICD-10-CM

## 2019-04-28 HISTORY — DX: Pneumonia, unspecified organism: J18.9

## 2019-06-12 ENCOUNTER — Encounter: Payer: Self-pay | Admitting: Cardiology

## 2019-06-12 ENCOUNTER — Encounter: Payer: Self-pay | Admitting: *Deleted

## 2019-06-12 ENCOUNTER — Ambulatory Visit (INDEPENDENT_AMBULATORY_CARE_PROVIDER_SITE_OTHER): Payer: BC Managed Care – PPO | Admitting: Cardiology

## 2019-06-12 ENCOUNTER — Other Ambulatory Visit: Payer: Self-pay

## 2019-06-12 VITALS — BP 138/84 | HR 64 | Ht 61.0 in | Wt 215.6 lb

## 2019-06-12 DIAGNOSIS — R0602 Shortness of breath: Secondary | ICD-10-CM | POA: Diagnosis not present

## 2019-06-12 DIAGNOSIS — I10 Essential (primary) hypertension: Secondary | ICD-10-CM

## 2019-06-12 DIAGNOSIS — E119 Type 2 diabetes mellitus without complications: Secondary | ICD-10-CM | POA: Diagnosis not present

## 2019-06-12 MED ORDER — AMLODIPINE BESYLATE 2.5 MG PO TABS: 2.5000 mg | ORAL_TABLET | Freq: Every day | ORAL | 1 refills | Status: AC

## 2019-06-12 NOTE — Progress Notes (Signed)
Cardiology Office Note:    Date:  06/13/2019   ID:  Jonny Ruiz, DOB 1966-02-18, MRN [id number]  PCP:  [facility]  Cardiologist:  Thomasene Ripple, DO  Electrophysiologist:  None   Referring MD: Physicians, Quin [facility]*   The patient was referred by her PCP due to murmur.  History of Present Illness:    Catherine Newman is a 53 y.o. female with a hx of diabetes, hypertension presents today to be evaluated for preoperative clearance.  The patient reports that she has been experiencing some shortness of breath on exertion.  Although she tells me that she is limited due to bilateral knee pain.  She denies any chest pain, lightheadedness or dizziness.  She is not short of breath in the office today.  Past Medical History:  Diagnosis Date   Complication of anesthesia    Diabetes mellitus without complication (HCC)    Hypertension    Inappropriate lactation    Occasional L breast   Migraine    migraines   Miscarriage    x 6   PONV (postoperative nausea and vomiting)     Past Surgical History:  Procedure Laterality Date   DILATION AND CURETTAGE OF UTERUS     ECTOPIC PREGNANCY SURGERY     KNEE ARTHROSCOPY Right    SALPINGOOPHORECTOMY Right     Current Medications: Current Meds  Medication Sig   ibuprofen (ADVIL,MOTRIN) 800 MG tablet Take 800 mg by mouth every 8 (eight) hours as needed for mild pain (pain).    Insulin Glargine (BASAGLAR KWIKPEN) 100 UNIT/ML SOPN Inject 32 Units into the skin 2 (two) times daily.    lisinopril (ZESTRIL) 30 MG tablet Take 30 mg by mouth daily.    Melatonin 3 MG TABS Take 3 mg by mouth at bedtime.   metFORMIN (GLUCOPHAGE) 500 MG tablet Take 1 tablet by mouth 2 (two) times daily.   traMADol (ULTRAM) 50 MG tablet Take 50 mg by mouth at bedtime.     Allergies:   Patient has no active allergies.   Social History   Socioeconomic History   Marital status: Married    Spouse name: Not on file   Number of children: 1    Years of education: Master's   Highest education level: Not on file  Occupational History   Occupation: [employer]    Comment: Patent examiner strain: Not on file   Food insecurity    Worry: Not on file    Inability: Not on file   Transportation needs    Medical: Not on file    Non-medical: Not on file  Tobacco Use   Smoking status: Former Smoker    Types: Cigarettes    Quit date: 1998    Years since quitting: 22.7   Smokeless tobacco: Never Used  Substance and Sexual Activity   Alcohol use: No    Comment: Quit 1994   Drug use: No   Sexual activity: Yes    Partners: Male    Comment: Married  Lifestyle   Physical activity    Days per week: Not on file    Minutes per session: Not on file   Stress: Not on file  Relationships   Social connections    Talks on phone: Not on file    Gets together: Not on file    Attends religious service: Not on file    Active member of club or organization: Not on file  Attends meetings of clubs or organizations: Not on file    Relationship status: Not on file  Other Topics Concern   Not on file  Social History Narrative   Lives at home w/ her husband and daughter   Right-handed   Caffeine: 16 oz     Family History: The patient's family history includes Cancer in her paternal aunt; Diabetes in her maternal aunt and maternal uncle; Endometriosis in her sister; Leukemia in her father and mother; Lung cancer in her maternal grandfather; Stroke in her maternal grandmother. There is no history of Vision loss.  ROS:     Review of Systems  Constitution: Negative for decreased appetite, malaise/fatigue and weight gain.  HENT: Negative for congestion, ear pain and hoarse voice.   Eyes: Negative for blurred vision, discharge, redness and vision loss in right eye.  Cardiovascular: Positive for dyspnea on exertion. Negative for chest pain, leg swelling and near-syncope.  Respiratory: Positive for  wheezing. Negative for cough and sputum production.   Endocrine: Negative for cold intolerance, polydipsia and polyuria.  Skin: Negative for color change, itching and skin cancer.  Musculoskeletal: Negative for arthritis, joint pain and muscle weakness.  Gastrointestinal: Negative for bloating, change in bowel habit and constipation.  Genitourinary: Negative for bladder incontinence, flank pain and frequency.  Neurological: Negative for brief paralysis, dizziness, loss of balance and weakness.  Psychiatric/Behavioral: Negative for depression, memory loss and substance abuse.  Allergic/Immunologic: Negative for HIV exposure and persistent infections.     EKGs/Labs/Other Studies Reviewed:    The following studies were reviewed today:   EKG: The ekg ordered today demonstrates sinus rhythm, heart rate 64 bpm, nonspecific ST changes.  Recent Labs: 08/22/2018: ALT 13; BUN 17; Creatinine, Ser 0.38; Hemoglobin 14.1; Platelets 190; Potassium 4.3; Sodium 138  Recent Lipid Panel    Component Value Date/Time   CHOL 178 07/25/2015 0523   TRIG 195 (H) 07/25/2015 0523   HDL 27 (L) 07/25/2015 0523   CHOLHDL 6.6 07/25/2015 0523   VLDL 39 07/25/2015 0523   LDLCALC 112 (H) 07/25/2015 0523    Physical Exam:    VS:  BP 138/84 (BP Location: Left Arm, Patient Position: Sitting, Cuff Size: Normal)    Pulse 64    Ht 5\' 1"  (1.549 m)    Wt 215 lb 9.6 oz (97.8 kg)    SpO2 97%    BMI 40.74 kg/m     Wt Readings from Last 3 Encounters:  06/12/19 215 lb 9.6 oz (97.8 kg)  08/22/18 214 lb (97.1 kg)  02/09/17 206 lb (93.4 kg)     GEN: Well nourished, well developed in no acute distress HEENT: Normal NECK: No JVD; No carotid bruits LYMPHATICS: No lymphadenopathy CARDIAC: S1, S2 noted.  RRR, no murmurs, rubs, gallops RESPIRATORY:  Clear to auscultation without rales, wheezing or rhonchi  ABDOMEN: Soft, non-tender, non-distended EXTREMITIES: Trace ankle edema, no cyanosis, no clubbing. MUSCULOSKELETAL:   No edema; No deformity  SKIN: Warm and dry NEUROLOGIC:  Alert and oriented x 3, nonfocal PSYCHIATRIC:  Normal affect, good insight  ASSESSMENT:    1. Type 2 diabetes mellitus without complication, unspecified whether long term insulin use (HCC)   2. Shortness of breath   3. Essential hypertension    PLAN:    In order of problems listed above:  1. Mr. Ola SpurrFrazer has symptoms of shortness of breath in the setting of her diabetes believes this could be her anginal equivalent.  She does have risk factors for coronary artery disease therefore  we will pursue ischemic evaluation.  A pharmacologic nuclear stress test has been ordered.  The patient was educated about the test risk and benefits explained.  She is in agreement to proceed with this test 2. A transthoracic echocardiogram has been ordered to assess her mid ejection systolic murmur as well as for any structural abnormalities especially in the setting of her hypertension. 3. Her blood pressure is not well controlled therefore I have added amlodipine 2.5 mg daily to her daily regimen.  She has been advised to take her blood pressure daily.  Avoid and or decrease salt intake.  She will follow-up in 1 month.  The patient is agreement with the above plan.  Disposition:   Medication Adjustments/Labs and Tests Ordered: Current medicines are reviewed at length with the patient today.  Concerns regarding medicines are outlined above.  Orders Placed This Encounter  Procedures   MYOCARDIAL PERFUSION IMAGING   EKG 12-Lead   ECHOCARDIOGRAM COMPLETE   Meds ordered this encounter  Medications   amLODipine (NORVASC) 2.5 MG tablet    Sig: Take 1 tablet (2.5 mg total) by mouth daily.    Dispense:  90 tablet    Refill:  1    Patient Instructions  Medication Instructions:  Your physician has recommended you make the following change in your medication:  START: Amlodipine(Norvasc) 2.5 mg Take 1 tab daily  If you need a refill on your  cardiac medications before your next appointment, please call your pharmacy.   Lab work: none If you have labs (blood work) drawn today and your tests are completely normal, you will receive your results only by:  MyChart Message (if you have MyChart) OR  A paper copy in the mail If you have any lab test that is abnormal or we need to change your treatment, we will call you to review the results.  Testing/Procedures: Your physician has requested that you have an echocardiogram. Echocardiography is a painless test that uses sound waves to create images of your heart. It provides your doctor with information about the size and shape of your heart and how well your hearts chambers and valves are working. This procedure takes approximately one hour. There are no restrictions for this procedure.  Your physician has requested that you have a lexiscan myoview. For further information please visit [email or web address] Please follow instruction sheet, as given.   Follow-Up: At [facility], you and your health needs are our priority.  As part of our continuing mission to provide you with exceptional heart care, we have created designated Provider Care Teams.  These Care Teams include your primary Cardiologist (physician) and Advanced Practice Providers (APPs -  Physician Assistants and Nurse Practitioners) who all work together to provide you with the care you need, when you need it. You will need a follow up appointment in 1 months.  Please call our office 2 months in advance to schedule this appointment.  You may see Thomasene Ripple, DO  Any Other Special Instructions Will Be Listed Below (If Applicable).  Amlodipine tablets What is this medicine? AMLODIPINE (am LOE di peen) is a calcium-channel blocker. It affects the amount of calcium found in your heart and muscle cells. This relaxes your blood vessels, which can reduce the amount of work the heart has to do. This medicine is used to lower high  blood pressure. It is also used to prevent chest pain. This medicine may be used for other purposes; ask your health care provider or pharmacist if you have  questions. COMMON BRAND NAME(S): Norvasc What should I tell my health care provider before I take this medicine? They need to know if you have any of these conditions:  heart disease  liver disease  an unusual or allergic reaction to amlodipine, other medicines, foods, dyes, or preservatives  pregnant or trying to get pregnant  breast-feeding How should I use this medicine? Take this medicine by mouth with a glass of water. Follow the directions on the prescription label. You can take it with or without food. If it upsets your stomach, take it with food. Take your medicine at regular intervals. Do not take it more often than directed. Do not stop taking except on your doctor's advice. Talk to your pediatrician regarding the use of this medicine in children. While this drug may be prescribed for children as young as 6 years for selected conditions, precautions do apply. Patients over 40 years of age may have a stronger reaction and need a smaller dose. Overdosage: If you think you have taken too much of this medicine contact a poison control center or emergency room at once. NOTE: This medicine is only for you. Do not share this medicine with others. What if I miss a dose? If you miss a dose, take it as soon as you can. If it is almost time for your next dose, take only that dose. Do not take double or extra doses. What may interact with this medicine? Do not take this medicine with any of the following medications:  tranylcypromine This medicine may also interact with the following medications:  clarithromycin  cyclosporine  diltiazem  itraconazole  simvastatin  tacrolimus This list may not describe all possible interactions. Give your health care provider a list of all the medicines, herbs, non-prescription drugs, or  dietary supplements you use. Also tell them if you smoke, drink alcohol, or use illegal drugs. Some items may interact with your medicine. What should I watch for while using this medicine? Visit your healthcare professional for regular checks on your progress. Check your blood pressure as directed. Ask your healthcare professional what your blood pressure should be and when you should contact him or her. Do not treat yourself for coughs, colds, or pain while you are using this medicine without asking your healthcare professional for advice. Some medicines may increase your blood pressure. You may get dizzy. Do not drive, use machinery, or do anything that needs mental alertness until you know how this medicine affects you. Do not stand or sit up quickly, especially if you are an older patient. This reduces the risk of dizzy or fainting spells. Avoid alcoholic drinks; they can make you dizzier. What side effects may I notice from receiving this medicine? Side effects that you should report to your doctor or health care professional as soon as possible:  allergic reactions like skin rash, itching or hives; swelling of the face, lips, or tongue  fast, irregular heartbeat  signs and symptoms of low blood pressure like dizziness; feeling faint or lightheaded, falls; unusually weak or tired  swelling of ankles, feet, hands Side effects that usually do not require medical attention (report these to your doctor or health care professional if they continue or are bothersome):  dry mouth  facial flushing  headache  stomach pain  tiredness This list may not describe all possible side effects. Call your doctor for medical advice about side effects. You may report side effects to FDA at 1-800-FDA-1088. Where should I keep my medicine? Keep out  of the reach of children. Store at room temperature between 59 and 86 degrees F (15 and 30 degrees C). Throw away any unused medicine after the expiration  date. NOTE: This sheet is a summary. It may not cover all possible information. If you have questions about this medicine, talk to your doctor, pharmacist, or health care provider.  2020 Elsevier/Gold Standard (2018-04-07 15:07:10)  Echocardiogram An echocardiogram is a procedure that uses painless sound waves (ultrasound) to produce an image of the heart. Images from an echocardiogram can provide important information about:  Signs of coronary artery disease (CAD).  Aneurysm detection. An aneurysm is a weak or damaged part of an artery wall that bulges out from the normal force of blood pumping through the body.  Heart size and shape. Changes in the size or shape of the heart can be associated with certain conditions, including heart failure, aneurysm, and CAD.  Heart muscle function.  Heart valve function.  Signs of a past heart attack.  Fluid buildup around the heart.  Thickening of the heart muscle.  A tumor or infectious growth around the heart valves. Tell a health care provider about:  Any allergies you have.  All medicines you are taking, including vitamins, herbs, eye drops, creams, and over-the-counter medicines.  Any blood disorders you have.  Any surgeries you have had.  Any medical conditions you have.  Whether you are pregnant or may be pregnant. What are the risks? Generally, this is a safe procedure. However, problems may occur, including:  Allergic reaction to dye (contrast) that may be used during the procedure. What happens before the procedure? No specific preparation is needed. You may eat and drink normally. What happens during the procedure?   An IV tube may be inserted into one of your veins.  You may receive contrast through this tube. A contrast is an injection that improves the quality of the pictures from your heart.  A gel will be applied to your chest.  A wand-like tool (transducer) will be moved over your chest. The gel will help to  transmit the sound waves from the transducer.  The sound waves will harmlessly bounce off of your heart to allow the heart images to be captured in real-time motion. The images will be recorded on a computer. The procedure may vary among health care providers and hospitals. What happens after the procedure?  You may return to your normal, everyday life, including diet, activities, and medicines, unless your health care provider tells you not to do that. Summary  An echocardiogram is a procedure that uses painless sound waves (ultrasound) to produce an image of the heart.  Images from an echocardiogram can provide important information about the size and shape of your heart, heart muscle function, heart valve function, and fluid buildup around your heart.  You do not need to do anything to prepare before this procedure. You may eat and drink normally.  After the echocardiogram is completed, you may return to your normal, everyday life, unless your health care provider tells you not to do that. This information is not intended to replace advice given to you by your health care provider. Make sure you discuss any questions you have with your health care provider. Document Released: 09/10/2000 Document Revised: 01/04/2019 Document Reviewed: 10/16/2016 Elsevier Patient Education  2020 [place].  Cardiac Nuclear Scan A cardiac nuclear scan is a test that is done to check the flow of blood to your heart. It is done when you are  resting and when you are exercising. The test looks for problems such as:  Not enough blood reaching a portion of the heart.  The heart muscle not working as it should. You may need this test if:  You have heart disease.  You have had lab results that are not normal.  You have had heart surgery or a balloon procedure to open up blocked arteries (angioplasty).  You have chest pain.  You have shortness of breath. In this test, a special dye (tracer) is put  into your bloodstream. The tracer will travel to your heart. A camera will then take pictures of your heart to see how the tracer moves through your heart. This test is usually done at a hospital and takes 2-4 hours. Tell a doctor about:  Any allergies you have.  All medicines you are taking, including vitamins, herbs, eye drops, creams, and over-the-counter medicines.  Any problems you or family members have had with anesthetic medicines.  Any blood disorders you have.  Any surgeries you have had.  Any medical conditions you have.  Whether you are pregnant or may be pregnant. What are the risks? Generally, this is a safe test. However, problems may occur, such as:  Serious chest pain and heart attack. This is only a risk if the stress portion of the test is done.  Rapid heartbeat.  A feeling of warmth in your chest. This feeling usually does not last long.  Allergic reaction to the tracer. What happens before the test?  Ask your doctor about changing or stopping your normal medicines. This is important.  Follow instructions from your doctor about what you cannot eat or drink.  Remove your jewelry on the day of the test. What happens during the test?  An IV tube will be inserted into one of your veins.  Your doctor will give you a small amount of tracer through the IV tube.  You will wait for 20-40 minutes while the tracer moves through your bloodstream.  Your heart will be monitored with an electrocardiogram (ECG).  You will lie down on an exam table.  Pictures of your heart will be taken for about 15-20 minutes.  You may also have a stress test. For this test, one of these things may be done: ? You will be asked to exercise on a treadmill or a stationary bike. ? You will be given medicines that will make your heart work harder. This is done if you are unable to exercise.  When blood flow to your heart has peaked, a tracer will again be given through the IV  tube.  After 20-40 minutes, you will get back on the exam table. More pictures will be taken of your heart.  Depending on the tracer that is used, more pictures may need to be taken 3-4 hours later.  Your IV tube will be removed when the test is over. The test may vary among doctors and hospitals. What happens after the test?  Ask your doctor: ? Whether you can return to your normal schedule, including diet, activities, and medicines. ? Whether you should drink more fluids. This will help to remove the tracer from your body. Drink enough fluid to keep your pee (urine) pale yellow.  Ask your doctor, or the department that is doing the test: ? When will my results be ready? ? How will I get my results? Summary  A cardiac nuclear scan is a test that is done to check the flow of blood to  your heart.  Tell your doctor whether you are pregnant or may be pregnant.  Before the test, ask your doctor about changing or stopping your normal medicines. This is important.  Ask your doctor whether you can return to your normal activities. You may be asked to drink more fluids. This information is not intended to replace advice given to you by your health care provider. Make sure you discuss any questions you have with your health care provider. Document Released: 02/27/2018 Document Revised: 01/03/2019 Document Reviewed: 02/27/2018 Elsevier Patient Education  [address]       Signed, Thomasene Ripple, DO  06/13/2019 8:15 AM    [facility]

## 2019-06-12 NOTE — Patient Instructions (Addendum)
Medication Instructions:  Your physician has recommended you make the following change in your medication:  START: Amlodipine(Norvasc) 2.5 mg Take 1 tab daily  If you need a refill on your cardiac medications before your next appointment, please call your pharmacy.   Lab work: none If you have labs (blood work) drawn today and your tests are completely normal, you will receive your results only by: Marland Kitchen. MyChart Message (if you have MyChart) OR . A paper copy in the mail If you have any lab test that is abnormal or we need to change your treatment, we will call you to review the results.  Testing/Procedures: Your physician has requested that you have an echocardiogram. Echocardiography is a painless test that uses sound waves to create images of your heart. It provides your doctor with information about the size and shape of your heart and how well your heart's chambers and valves are working. This procedure takes approximately one hour. There are no restrictions for this procedure.  Your physician has requested that you have a lexiscan myoview. For further information please visit [email or web address]. Please follow instruction sheet, as given.   Follow-Up: At [facility], you and your health needs are our priority.  As part of our continuing mission to provide you with exceptional heart care, we have created designated Provider Care Teams.  These Care Teams include your primary Cardiologist (physician) and Advanced Practice Providers (APPs -  Physician Assistants and Nurse Practitioners) who all work together to provide you with the care you need, when you need it. You will need a follow up appointment in 1 months.  Please call our office 2 months in advance to schedule this appointment.  You may see Thomasene RippleKardie Tobb, DO  Any Other Special Instructions Will Be Listed Below (If Applicable).  Amlodipine tablets What is this medicine? AMLODIPINE (am LOE di peen) is a calcium-channel blocker. It  affects the amount of calcium found in your heart and muscle cells. This relaxes your blood vessels, which can reduce the amount of work the heart has to do. This medicine is used to lower high blood pressure. It is also used to prevent chest pain. This medicine may be used for other purposes; ask your health care provider or pharmacist if you have questions. COMMON BRAND NAME(S): Norvasc What should I tell my health care provider before I take this medicine? They need to know if you have any of these conditions:  heart disease  liver disease  an unusual or allergic reaction to amlodipine, other medicines, foods, dyes, or preservatives  pregnant or trying to get pregnant  breast-feeding How should I use this medicine? Take this medicine by mouth with a glass of water. Follow the directions on the prescription label. You can take it with or without food. If it upsets your stomach, take it with food. Take your medicine at regular intervals. Do not take it more often than directed. Do not stop taking except on your doctor's advice. Talk to your pediatrician regarding the use of this medicine in children. While this drug may be prescribed for children as young as 6 years for selected conditions, precautions do apply. Patients over 52 years of age may have a stronger reaction and need a smaller dose. Overdosage: If you think you have taken too much of this medicine contact a poison control center or emergency room at once. NOTE: This medicine is only for you. Do not share this medicine with others. What if I miss a dose? If you  miss a dose, take it as soon as you can. If it is almost time for your next dose, take only that dose. Do not take double or extra doses. What may interact with this medicine? Do not take this medicine with any of the following medications:  tranylcypromine This medicine may also interact with the following  medications:  clarithromycin  cyclosporine  diltiazem  itraconazole  simvastatin  tacrolimus This list may not describe all possible interactions. Give your health care provider a list of all the medicines, herbs, non-prescription drugs, or dietary supplements you use. Also tell them if you smoke, drink alcohol, or use illegal drugs. Some items may interact with your medicine. What should I watch for while using this medicine? Visit your healthcare professional for regular checks on your progress. Check your blood pressure as directed. Ask your healthcare professional what your blood pressure should be and when you should contact him or her. Do not treat yourself for coughs, colds, or pain while you are using this medicine without asking your healthcare professional for advice. Some medicines may increase your blood pressure. You may get dizzy. Do not drive, use machinery, or do anything that needs mental alertness until you know how this medicine affects you. Do not stand or sit up quickly, especially if you are an older patient. This reduces the risk of dizzy or fainting spells. Avoid alcoholic drinks; they can make you dizzier. What side effects may I notice from receiving this medicine? Side effects that you should report to your doctor or health care professional as soon as possible:  allergic reactions like skin rash, itching or hives; swelling of the face, lips, or tongue  fast, irregular heartbeat  signs and symptoms of low blood pressure like dizziness; feeling faint or lightheaded, falls; unusually weak or tired  swelling of ankles, feet, hands Side effects that usually do not require medical attention (report these to your doctor or health care professional if they continue or are bothersome):  dry mouth  facial flushing  headache  stomach pain  tiredness This list may not describe all possible side effects. Call your doctor for medical advice about side effects. You  may report side effects to FDA at 1-800-FDA-1088. Where should I keep my medicine? Keep out of the reach of children. Store at room temperature between 59 and 86 degrees F (15 and 30 degrees C). Throw away any unused medicine after the expiration date. NOTE: This sheet is a summary. It may not cover all possible information. If you have questions about this medicine, talk to your doctor, pharmacist, or health care provider.  2020 Elsevier/Gold Standard (2018-04-07 15:07:10)  Echocardiogram An echocardiogram is a procedure that uses painless sound waves (ultrasound) to produce an image of the heart. Images from an echocardiogram can provide important information about:  Signs of coronary artery disease (CAD).  Aneurysm detection. An aneurysm is a weak or damaged part of an artery wall that bulges out from the normal force of blood pumping through the body.  Heart size and shape. Changes in the size or shape of the heart can be associated with certain conditions, including heart failure, aneurysm, and CAD.  Heart muscle function.  Heart valve function.  Signs of a past heart attack.  Fluid buildup around the heart.  Thickening of the heart muscle.  A tumor or infectious growth around the heart valves. Tell a health care provider about:  Any allergies you have.  All medicines you are taking, including vitamins, herbs,  eye drops, creams, and over-the-counter medicines.  Any blood disorders you have.  Any surgeries you have had.  Any medical conditions you have.  Whether you are pregnant or may be pregnant. What are the risks? Generally, this is a safe procedure. However, problems may occur, including:  Allergic reaction to dye (contrast) that may be used during the procedure. What happens before the procedure? No specific preparation is needed. You may eat and drink normally. What happens during the procedure?   An IV tube may be inserted into one of your veins.  You  may receive contrast through this tube. A contrast is an injection that improves the quality of the pictures from your heart.  A gel will be applied to your chest.  A wand-like tool (transducer) will be moved over your chest. The gel will help to transmit the sound waves from the transducer.  The sound waves will harmlessly bounce off of your heart to allow the heart images to be captured in real-time motion. The images will be recorded on a computer. The procedure may vary among health care providers and hospitals. What happens after the procedure?  You may return to your normal, everyday life, including diet, activities, and medicines, unless your health care provider tells you not to do that. Summary  An echocardiogram is a procedure that uses painless sound waves (ultrasound) to produce an image of the heart.  Images from an echocardiogram can provide important information about the size and shape of your heart, heart muscle function, heart valve function, and fluid buildup around your heart.  You do not need to do anything to prepare before this procedure. You may eat and drink normally.  After the echocardiogram is completed, you may return to your normal, everyday life, unless your health care provider tells you not to do that. This information is not intended to replace advice given to you by your health care provider. Make sure you discuss any questions you have with your health care provider. Document Released: 09/10/2000 Document Revised: 01/04/2019 Document Reviewed: 10/16/2016 Elsevier Patient Education  2020 Elsevier Inc.  Cardiac Nuclear Scan A cardiac nuclear scan is a test that is done to check the flow of blood to your heart. It is done when you are resting and when you are exercising. The test looks for problems such as:  Not enough blood reaching a portion of the heart.  The heart muscle not working as it should. You may need this test if:  You have heart  disease.  You have had lab results that are not normal.  You have had heart surgery or a balloon procedure to open up blocked arteries (angioplasty).  You have chest pain.  You have shortness of breath. In this test, a special dye (tracer) is put into your bloodstream. The tracer will travel to your heart. A camera will then take pictures of your heart to see how the tracer moves through your heart. This test is usually done at a hospital and takes 2-4 hours. Tell a doctor about:  Any allergies you have.  All medicines you are taking, including vitamins, herbs, eye drops, creams, and over-the-counter medicines.  Any problems you or family members have had with anesthetic medicines.  Any blood disorders you have.  Any surgeries you have had.  Any medical conditions you have.  Whether you are pregnant or may be pregnant. What are the risks? Generally, this is a safe test. However, problems may occur, such as:  Serious chest  pain and heart attack. This is only a risk if the stress portion of the test is done.  Rapid heartbeat.  A feeling of warmth in your chest. This feeling usually does not last long.  Allergic reaction to the tracer. What happens before the test?  Ask your doctor about changing or stopping your normal medicines. This is important.  Follow instructions from your doctor about what you cannot eat or drink.  Remove your jewelry on the day of the test. What happens during the test?  An IV tube will be inserted into one of your veins.  Your doctor will give you a small amount of tracer through the IV tube.  You will wait for 20-40 minutes while the tracer moves through your bloodstream.  Your heart will be monitored with an electrocardiogram (ECG).  You will lie down on an exam table.  Pictures of your heart will be taken for about 15-20 minutes.  You may also have a stress test. For this test, one of these things may be done: ? You will be asked to  exercise on a treadmill or a stationary bike. ? You will be given medicines that will make your heart work harder. This is done if you are unable to exercise.  When blood flow to your heart has peaked, a tracer will again be given through the IV tube.  After 20-40 minutes, you will get back on the exam table. More pictures will be taken of your heart.  Depending on the tracer that is used, more pictures may need to be taken 3-4 hours later.  Your IV tube will be removed when the test is over. The test may vary among doctors and hospitals. What happens after the test?  Ask your doctor: ? Whether you can return to your normal schedule, including diet, activities, and medicines. ? Whether you should drink more fluids. This will help to remove the tracer from your body. Drink enough fluid to keep your pee (urine) pale yellow.  Ask your doctor, or the department that is doing the test: ? When will my results be ready? ? How will I get my results? Summary  A cardiac nuclear scan is a test that is done to check the flow of blood to your heart.  Tell your doctor whether you are pregnant or may be pregnant.  Before the test, ask your doctor about changing or stopping your normal medicines. This is important.  Ask your doctor whether you can return to your normal activities. You may be asked to drink more fluids. This information is not intended to replace advice given to you by your health care provider. Make sure you discuss any questions you have with your health care provider. Document Released: 02/27/2018 Document Revised: 01/03/2019 Document Reviewed: 02/27/2018 Elsevier Patient Education  2020 [employer]

## 2019-06-13 ENCOUNTER — Encounter: Payer: Self-pay | Admitting: Cardiology

## 2019-07-11 ENCOUNTER — Telehealth (HOSPITAL_COMMUNITY): Payer: Self-pay | Admitting: *Deleted

## 2019-07-11 NOTE — Telephone Encounter (Signed)
Left message on voicemail per DPR in reference to upcoming appointment scheduled on 07/17/19 with detailed instructions given per Myocardial Perfusion Study Information Sheet for the test. LM to arrive 15 minutes early, and that it is imperative to arrive on time for appointment to keep from having the test rescheduled. If you need to cancel or reschedule your appointment, please call the office within 24 hours of your appointment. Failure to do so may result in a cancellation of your appointment, and a $50 no show fee. Phone number given for call back for any questions. Aliya Sol Jacqueline    

## 2019-07-13 ENCOUNTER — Ambulatory Visit (INDEPENDENT_AMBULATORY_CARE_PROVIDER_SITE_OTHER): Payer: BC Managed Care – PPO

## 2019-07-13 ENCOUNTER — Other Ambulatory Visit: Payer: Self-pay

## 2019-07-13 DIAGNOSIS — R0602 Shortness of breath: Secondary | ICD-10-CM | POA: Diagnosis not present

## 2019-07-13 NOTE — Progress Notes (Signed)
Complete echocardiogram has been performed.  Jimmy Khylin Gutridge RDCS, RVT 

## 2019-07-17 ENCOUNTER — Encounter: Payer: Self-pay | Admitting: *Deleted

## 2019-07-17 ENCOUNTER — Ambulatory Visit (INDEPENDENT_AMBULATORY_CARE_PROVIDER_SITE_OTHER): Payer: BC Managed Care – PPO

## 2019-07-17 ENCOUNTER — Telehealth: Payer: Self-pay | Admitting: *Deleted

## 2019-07-17 ENCOUNTER — Other Ambulatory Visit: Payer: Self-pay

## 2019-07-17 DIAGNOSIS — R0602 Shortness of breath: Secondary | ICD-10-CM

## 2019-07-17 MED ORDER — REGADENOSON 0.4 MG/5ML IV SOLN
0.4000 mg | Freq: Once | INTRAVENOUS | Status: AC
  Administered 2019-07-17: 0.4 mg via INTRAVENOUS

## 2019-07-17 MED ORDER — TECHNETIUM TC 99M TETROFOSMIN IV KIT
31.8000 | PACK | Freq: Once | INTRAVENOUS | Status: AC | PRN
  Administered 2019-07-17: 31.8 via INTRAVENOUS

## 2019-07-17 MED ORDER — TECHNETIUM TC 99M TETROFOSMIN IV KIT
10.2000 | PACK | Freq: Once | INTRAVENOUS | Status: AC | PRN
  Administered 2019-07-17: 10.2 via INTRAVENOUS

## 2019-07-17 NOTE — Telephone Encounter (Signed)
Telephone call to patient. Left message that echo results were normal and to call with any questions. 

## 2019-07-17 NOTE — Telephone Encounter (Signed)
-----   Message from Berniece Salines, DO sent at 07/16/2019 11:20 PM EDT ----- Normal echo, please notify patient and forward result to pcp.

## 2019-07-18 ENCOUNTER — Ambulatory Visit (INDEPENDENT_AMBULATORY_CARE_PROVIDER_SITE_OTHER): Payer: BC Managed Care – PPO | Admitting: Cardiology

## 2019-07-18 ENCOUNTER — Encounter: Payer: Self-pay | Admitting: Cardiology

## 2019-07-18 ENCOUNTER — Ambulatory Visit: Payer: BC Managed Care – PPO | Admitting: Cardiology

## 2019-07-18 VITALS — BP 170/94 | HR 75 | Ht 61.0 in | Wt 222.0 lb

## 2019-07-18 DIAGNOSIS — E119 Type 2 diabetes mellitus without complications: Secondary | ICD-10-CM

## 2019-07-18 DIAGNOSIS — Z6841 Body Mass Index (BMI) 40.0 and over, adult: Secondary | ICD-10-CM | POA: Diagnosis not present

## 2019-07-18 DIAGNOSIS — I10 Essential (primary) hypertension: Secondary | ICD-10-CM

## 2019-07-18 LAB — MYOCARDIAL PERFUSION IMAGING
LV dias vol: 88 mL (ref 46–106)
LV sys vol: 27 mL
Peak HR: 86 {beats}/min
Rest HR: 66 {beats}/min
SDS: 3
SRS: 5
SSS: 8
TID: 1.04

## 2019-07-18 MED ORDER — CARVEDILOL 3.125 MG PO TABS: 3.1250 mg | ORAL_TABLET | Freq: Two times a day (BID) | ORAL | 1 refills | Status: AC

## 2019-07-18 NOTE — Patient Instructions (Signed)
Medication Instructions:  Your physician has recommended you make the following change in your medication:   START: COREG(carvedilol) 3.125 mg take 1 tab twice daily  *If you need a refill on your cardiac medications before your next appointment, please call your pharmacy*  Lab Work: NOne If you have labs (blood work) drawn today and your tests are completely normal, you will receive your results only by: Marland Kitchen MyChart Message (if you have MyChart) OR . A paper copy in the mail If you have any lab test that is abnormal or we need to change your treatment, we will call you to review the results.  Testing/Procedures: NOne  Follow-Up: At [facility], you and your health needs are our priority.  As part of our continuing mission to provide you with exceptional heart care, we have created designated Provider Care Teams.  These Care Teams include your primary Cardiologist (physician) and Advanced Practice Providers (APPs -  Physician Assistants and Nurse Practitioners) who all work together to provide you with the care you need, when you need it.  Your next appointment:   1 month  The format for your next appointment:   In Person  Provider:   Berniece Salines, DO  Other Instructions  Carvedilol tablets What is this medicine? CARVEDILOL (KAR ve dil ol) is a beta-blocker. Beta-blockers reduce the workload on the heart and help it to beat more regularly. This medicine is used to treat high blood pressure and heart failure. This medicine may be used for other purposes; ask your health care provider or pharmacist if you have questions. COMMON BRAND NAME(S): Coreg What should I tell my health care provider before I take this medicine? They need to know if you have any of these conditions:  circulation problems  diabetes  history of heart attack or heart disease  liver disease  lung or breathing disease, like asthma or emphysema  pheochromocytoma  slow or irregular  heartbeat  thyroid disease  an unusual or allergic reaction to carvedilol, other beta-blockers, medicines, foods, dyes, or preservatives  pregnant or trying to get pregnant  breast-feeding How should I use this medicine? Take this medicine by mouth with a glass of water. Follow the directions on the prescription label. It is best to take the tablets with food. Take your doses at regular intervals. Do not take your medicine more often than directed. Do not stop taking except on the advice of your doctor or health care professional. Talk to your pediatrician regarding the use of this medicine in children. Special care may be needed. Overdosage: If you think you have taken too much of this medicine contact a poison control center or emergency room at once. NOTE: This medicine is only for you. Do not share this medicine with others. What if I miss a dose? If you miss a dose, take it as soon as you can. If it is almost time for your next dose, take only that dose. Do not take double or extra doses. What may interact with this medicine? This medicine may interact with the following medications:  certain medicines for blood pressure, heart disease, irregular heart beat  certain medicines for depression, like fluoxetine or paroxetine  certain medicines for diabetes, like glipizide or glyburide  cimetidine  clonidine  cyclosporine  digoxin  MAOIs like Carbex, Eldepryl, Marplan, Nardil, and Parnate  reserpine  rifampin This list may not describe all possible interactions. Give your health care provider a list of all the medicines, herbs, non-prescription drugs, or dietary  supplements you use. Also tell them if you smoke, drink alcohol, or use illegal drugs. Some items may interact with your medicine. What should I watch for while using this medicine? Check your heart rate and blood pressure regularly while you are taking this medicine. Ask your doctor or health care professional what  your heart rate and blood pressure should be, and when you should contact him or her. Do not stop taking this medicine suddenly. This could lead to serious heart-related effects. Contact your doctor or health care professional if you have difficulty breathing while taking this drug. Check your weight daily. Ask your doctor or health care professional when you should notify him/her of any weight gain. You may get drowsy or dizzy. Do not drive, use machinery, or do anything that requires mental alertness until you know how this medicine affects you. To reduce the risk of dizzy or fainting spells, do not sit or stand up quickly. Alcohol can make you more drowsy, and increase flushing and rapid heartbeats. Avoid alcoholic drinks. This medicine may increase blood sugar. Ask your healthcare provider if changes in diet or medicines are needed if you have diabetes. If you are going to have surgery, tell your doctor or health care professional that you are taking this medicine. What side effects may I notice from receiving this medicine? Side effects that you should report to your doctor or health care professional as soon as possible:  allergic reactions like skin rash, itching or hives, swelling of the face, lips, or tongue  breathing problems  dark urine  irregular heartbeat   signs and symptoms of high blood sugar such as being more thirsty or hungry or having to urinate more than normal. You may also feel very tired or have blurry vision.  swollen legs or ankles  vomiting  yellowing of the eyes or skin Side effects that usually do not require medical attention (report to your doctor or health care professional if they continue or are bothersome):  change in sex drive or performance  diarrhea  dry eyes (especially if wearing contact lenses)  dry, itching skin  headache  nausea  unusually tired This list may not describe all possible side effects. Call your doctor for medical advice  about side effects. You may report side effects to FDA at 1-800-FDA-1088. Where should I keep my medicine? Keep out of the reach of children. Store at room temperature below 30 degrees C (86 degrees F). Protect from moisture. Keep container tightly closed. Throw away any unused medicine after the expiration date. NOTE: This sheet is a summary. It may not cover all possible information. If you have questions about this medicine, talk to your doctor, pharmacist, or health care provider.  2020 Elsevier/Gold Standard (2018-07-05 09:13:57)

## 2019-07-18 NOTE — Progress Notes (Signed)
Cardiology Office Note:    Date:  07/18/2019   ID:  Catherine Newman, DOB 1966-01-30, MRN [id number]  PCP:  [facility]  Cardiologist:  Thomasene Ripple, DO  Electrophysiologist:  None   Referring MD: Physicians, Catherine [facility]*   Chief Complaint  Patient presents with   Follow-up    echo and nuclear    History of Present Illness:    Catherine Newman is a 53 y.o. female with a hx of diabetes and hypertension.  Patient initially presented for preoperative clearance on June 12, 2019.  During our visit he complained of breath on exertion.  Given her risk factors pharmacologic nuclear stress test was ordered.  In addition to a systolic murmur heard on auscultation an echocardiogram was ordered as well.  She was placed on amlodipine 2.5 mg daily due to hypertension.  She denies any chest pain, shortness of breath, nausea, vomiting.  She still limited by her knee.  What she tells me is since starting amlodipine 2.5 mg she has been significant sleepy and is unable to do her daily activities due to her sleepiness.  Is concerned because the patient work as a Runner, broadcasting/film/video.  Past Medical History:  Diagnosis Date   Complication of anesthesia    Diabetes mellitus without complication (HCC)    Hypertension    Inappropriate lactation    Occasional L breast   Migraine    migraines   Miscarriage    x 6   PONV (postoperative nausea and vomiting)     Past Surgical History:  Procedure Laterality Date   DILATION AND CURETTAGE OF UTERUS     ECTOPIC PREGNANCY SURGERY     KNEE ARTHROSCOPY Right    SALPINGOOPHORECTOMY Right     Current Medications: Current Meds  Medication Sig   amLODipine (NORVASC) 2.5 MG tablet Take 1 tablet (2.5 mg total) by mouth daily.   ibuprofen (ADVIL,MOTRIN) 800 MG tablet Take 800 mg by mouth every 8 (eight) hours as needed for mild pain (pain).    Insulin Glargine (BASAGLAR KWIKPEN) 100 UNIT/ML SOPN Inject 32 Units into the skin 2 (two) times daily.      lisinopril (ZESTRIL) 30 MG tablet Take 30 mg by mouth daily.    Melatonin 3 MG TABS Take 3 mg by mouth at bedtime.   metFORMIN (GLUCOPHAGE) 500 MG tablet Take 1 tablet by mouth 2 (two) times daily.   traMADol (ULTRAM) 50 MG tablet Take 50 mg by mouth at bedtime.     Allergies:   Patient has no known allergies.   Social History   Socioeconomic History   Marital status: Married    Spouse name: Not on file   Number of children: 1   Years of education: Master's   Highest education level: Not on file  Occupational History   Occupation: [employer]    Comment: Patent examiner strain: Not on file   Food insecurity    Worry: Not on file    Inability: Not on file   Transportation needs    Medical: Not on file    Non-medical: Not on file  Tobacco Use   Smoking status: Former Smoker    Types: Cigarettes    Quit date: 1998    Years since quitting: 22.8   Smokeless tobacco: Never Used  Substance and Sexual Activity   Alcohol use: No    Comment: Quit 1994   Drug use: No   Sexual activity: Yes  Partners: Male    Comment: Married  Lifestyle   Physical activity    Days per week: Not on file    Minutes per session: Not on file   Stress: Not on file  Relationships   Social connections    Talks on phone: Not on file    Gets together: Not on file    Attends religious service: Not on file    Active member of club or organization: Not on file    Attends meetings of clubs or organizations: Not on file    Relationship status: Not on file  Other Topics Concern   Not on file  Social History Narrative   Lives at home w/ her husband and daughter   Right-handed   Caffeine: 16 oz     Family History: The patient's family history includes Cancer in her paternal aunt; Diabetes in her maternal aunt and maternal uncle; Endometriosis in her sister; Leukemia in her father and mother; Lung cancer in her maternal grandfather; Stroke in  her maternal grandmother. There is no history of Vision loss.  ROS:   Review of Systems  Constitution: Negative for decreased appetite, fever and weight gain.  HENT: Negative for congestion, ear discharge, hoarse voice and sore throat.   Eyes: Negative for discharge, redness, vision loss in right eye and visual halos.  Cardiovascular: Negative for chest pain, dyspnea on exertion, leg swelling, orthopnea and palpitations.  Respiratory: Negative for cough, hemoptysis, shortness of breath and snoring.   Endocrine: Negative for heat intolerance and polyphagia.  Hematologic/Lymphatic: Negative for bleeding problem. Does not bruise/bleed easily.  Skin: Negative for flushing, nail changes, rash and suspicious lesions.  Musculoskeletal: Negative for arthritis, joint pain, muscle cramps, myalgias, neck pain and stiffness.  Gastrointestinal: Negative for abdominal pain, bowel incontinence, diarrhea and excessive appetite.  Genitourinary: Negative for decreased libido, genital sores and incomplete emptying.  Neurological: Negative for brief paralysis, focal weakness, headaches and loss of balance.  Psychiatric/Behavioral: Negative for altered mental status, depression and suicidal ideas.  Allergic/Immunologic: Negative for HIV exposure and persistent infections.    EKGs/Labs/Other Studies Reviewed:    The following studies were reviewed today:   EKG:  None today.  TTE IMPRESSIONS 07/13/2019:  1. Left ventricular ejection fraction, by visual estimation, is 60 to 65%. The left ventricle has normal function. Normal left ventricular size. There is no left ventricular hypertrophy.  2. Global right ventricle has normal systolic function.The right ventricular size is normal. No increase in right ventricular wall thickness.  3. Left atrial size was normal.  4. Right atrial size was normal.  5. The mitral valve is normal in structure. No evidence of mitral valve regurgitation. No evidence of mitral  stenosis.  6. The tricuspid valve is normal in structure. Tricuspid valve regurgitation was not visualized by color flow Doppler.  7. The aortic valve is normal in structure. Aortic valve regurgitation was not visualized by color flow Doppler. Structurally normal aortic valve, with no evidence of sclerosis or stenosis.  8. The pulmonic valve was normal in structure. Pulmonic valve regurgitation is not visualized by color flow Doppler.  9. The inferior vena cava is normal in size with greater than 50% respiratory variability, suggesting right atrial pressure of 3 mmHg.  Nuclear stress test  The left ventricular ejection fraction is hyperdynamic (>65%).  Nuclear stress EF: 70%.  Defect 1: There is a small defect of mild severity present in the mid anterior and apical anterior location.  The study is normal.  This  is a low risk study.  The small fixed perfusion defect associated with normal wall motion is likely due to soft tissue attenuation.   Recent Labs: 08/22/2018: ALT 13; BUN 17; Creatinine, Ser 0.38; Hemoglobin 14.1; Platelets 190; Potassium 4.3; Sodium 138  Recent Lipid Panel    Component Value Date/Time   CHOL 178 07/25/2015 0523   TRIG 195 (H) 07/25/2015 0523   HDL 27 (L) 07/25/2015 0523   CHOLHDL 6.6 07/25/2015 0523   VLDL 39 07/25/2015 0523   LDLCALC 112 (H) 07/25/2015 0523    Physical Exam:    VS:  BP (!) 170/94 (BP Location: Left Arm, Patient Position: Sitting, Cuff Size: Large)    Pulse 75    Ht 5\' 1"  (1.549 m)    Wt 222 lb (100.7 kg)    SpO2 99%    BMI 41.95 kg/m     Wt Readings from Last 3 Encounters:  07/18/19 222 lb (100.7 kg)  07/17/19 215 lb (97.5 kg)  06/12/19 215 lb 9.6 oz (97.8 kg)     GEN: Well nourished, well developed in no acute distress HEENT: Normal NECK: No JVD; No carotid bruits LYMPHATICS: No lymphadenopathy CARDIAC: S1S2 noted,RRR, no murmurs, rubs, gallops RESPIRATORY:  Clear to auscultation without rales, wheezing or rhonchi    ABDOMEN: Soft, non-tender, non-distended, +bowel sounds, no guarding. EXTREMITIES: No edema, No cyanosis, no clubbing MUSCULOSKELETAL:  No edema; No deformity  SKIN: Warm and dry NEUROLOGIC:  Alert and oriented x 3, non-focal PSYCHIATRIC:  Normal affect, good insight  ASSESSMENT:    1. Essential hypertension   2. Type 2 diabetes mellitus without complication, without long-term current use of insulin (HCC)   3. Class 3 severe obesity without serious comorbidity with body mass index (BMI) of 40.0 to 44.9 in adult, unspecified obesity type ([place])    PLAN:    1.  Also discussed with the patient her pharmacologic stress test was normal, as well as her echocardiogram.  2.  He is hypertensive today in the office.  She currently is on amlodipine 2.5 mg daily as well as lisinopril 30 mg a day.  She reports that amlodipine has been making her very sleepy and is unable to go about her day.  Therefore, stop this medication.  We will start patient on carvedilol 3.125 mg twice daily.  Have educated patient that his new medication.  I discussed the side effects with her.  She is willing to proceed with taking this medication.  He was advised to call our office if he experience any lightheadedness or dizziness.  She was advised to take her blood pressure daily.  3.  Given her normal stress test.  The patient does not have any unstable cardiac conditions.  Upon evaluation today, she can achieve 4 METs or greater without anginal symptoms.  According to [facility] and AHA guidelines, she requires no further cardiac workup prior to her noncardiac surgery (right knee surgery) and should be at acceptable risk.    The patient is in agreement with the above plan. The patient left the office in stable condition.  The patient will follow up in 1 month if needed.  Medication Adjustments/Labs and Tests Ordered: Current medicines are reviewed at length with the patient today.  Concerns regarding medicines are outlined above.   No orders of the defined types were placed in this encounter.  Meds ordered this encounter  Medications   carvedilol (COREG) 3.125 MG tablet    Sig: Take 1 tablet (3.125 mg total) by mouth  2 (two) times daily.    Dispense:  180 tablet    Refill:  1    Patient Instructions  Medication Instructions:  Your physician has recommended you make the following change in your medication:   START: COREG(carvedilol) 3.125 mg take 1 tab twice daily  *If you need a refill on your cardiac medications before your next appointment, please call your pharmacy*  Lab Work: NOne If you have labs (blood work) drawn today and your tests are completely normal, you will receive your results only by:  MyChart Message (if you have MyChart) OR  A paper copy in the mail If you have any lab test that is abnormal or we need to change your treatment, we will call you to review the results.  Testing/Procedures: NOne  Follow-Up: At [facility], you and your health needs are our priority.  As part of our continuing mission to provide you with exceptional heart care, we have created designated Provider Care Teams.  These Care Teams include your primary Cardiologist (physician) and Advanced Practice Providers (APPs -  Physician Assistants and Nurse Practitioners) who all work together to provide you with the care you need, when you need it.  Your next appointment:   1 month  The format for your next appointment:   In Person  Provider:   Thomasene Ripple, DO  Other Instructions  Carvedilol tablets What is this medicine? CARVEDILOL (KAR ve dil ol) is a beta-blocker. Beta-blockers reduce the workload on the heart and help it to beat more regularly. This medicine is used to treat high blood pressure and heart failure. This medicine may be used for other purposes; ask your health care provider or pharmacist if you have questions. COMMON BRAND NAME(S): Coreg What should I tell my health care provider before I  take this medicine? They need to know if you have any of these conditions:  circulation problems  diabetes  history of heart attack or heart disease  liver disease  lung or breathing disease, like asthma or emphysema  pheochromocytoma  slow or irregular heartbeat  thyroid disease  an unusual or allergic reaction to carvedilol, other beta-blockers, medicines, foods, dyes, or preservatives  pregnant or trying to get pregnant  breast-feeding How should I use this medicine? Take this medicine by mouth with a glass of water. Follow the directions on the prescription label. It is best to take the tablets with food. Take your doses at regular intervals. Do not take your medicine more often than directed. Do not stop taking except on the advice of your doctor or health care professional. Talk to your pediatrician regarding the use of this medicine in children. Special care may be needed. Overdosage: If you think you have taken too much of this medicine contact a poison control center or emergency room at once. NOTE: This medicine is only for you. Do not share this medicine with others. What if I miss a dose? If you miss a dose, take it as soon as you can. If it is almost time for your next dose, take only that dose. Do not take double or extra doses. What may interact with this medicine? This medicine may interact with the following medications:  certain medicines for blood pressure, heart disease, irregular heart beat  certain medicines for depression, like fluoxetine or paroxetine  certain medicines for diabetes, like glipizide or glyburide  cimetidine  clonidine  cyclosporine  digoxin  MAOIs like Carbex, Eldepryl, Marplan, Nardil, and Parnate  reserpine  rifampin This  list may not describe all possible interactions. Give your health care provider a list of all the medicines, herbs, non-prescription drugs, or dietary supplements you use. Also tell them if you smoke,  drink alcohol, or use illegal drugs. Some items may interact with your medicine. What should I watch for while using this medicine? Check your heart rate and blood pressure regularly while you are taking this medicine. Ask your doctor or health care professional what your heart rate and blood pressure should be, and when you should contact him or her. Do not stop taking this medicine suddenly. This could lead to serious heart-related effects. Contact your doctor or health care professional if you have difficulty breathing while taking this drug. Check your weight daily. Ask your doctor or health care professional when you should notify him/her of any weight gain. You may get drowsy or dizzy. Do not drive, use machinery, or do anything that requires mental alertness until you know how this medicine affects you. To reduce the risk of dizzy or fainting spells, do not sit or stand up quickly. Alcohol can make you more drowsy, and increase flushing and rapid heartbeats. Avoid alcoholic drinks. This medicine may increase blood sugar. Ask your healthcare provider if changes in diet or medicines are needed if you have diabetes. If you are going to have surgery, tell your doctor or health care professional that you are taking this medicine. What side effects may I notice from receiving this medicine? Side effects that you should report to your doctor or health care professional as soon as possible:  allergic reactions like skin rash, itching or hives, swelling of the face, lips, or tongue  breathing problems  dark urine  irregular heartbeat   signs and symptoms of high blood sugar such as being more thirsty or hungry or having to urinate more than normal. You may also feel very tired or have blurry vision.  swollen legs or ankles  vomiting  yellowing of the eyes or skin Side effects that usually do not require medical attention (report to your doctor or health care professional if they continue or  are bothersome):  change in sex drive or performance  diarrhea  dry eyes (especially if wearing contact lenses)  dry, itching skin  headache  nausea  unusually tired This list may not describe all possible side effects. Call your doctor for medical advice about side effects. You may report side effects to FDA at 1-800-FDA-1088. Where should I keep my medicine? Keep out of the reach of children. Store at room temperature below 30 degrees C (86 degrees F). Protect from moisture. Keep container tightly closed. Throw away any unused medicine after the expiration date. NOTE: This sheet is a summary. It may not cover all possible information. If you have questions about this medicine, talk to your doctor, pharmacist, or health care provider.  2020 Elsevier/Gold Standard (2018-07-05 09:13:57)      Adopting a Healthy Lifestyle.  Know what a healthy weight is for you (roughly BMI <25) and aim to maintain this   Aim for 7+ servings of fruits and vegetables daily   65-80+ fluid ounces of water or unsweet tea for healthy kidneys   Limit to max 1 drink of alcohol per day; avoid smoking/tobacco   Limit animal fats in diet for cholesterol and heart health - choose grass fed whenever available   Avoid highly processed foods, and foods high in saturated/trans fats   Aim for low stress - take time to unwind and  care for your mental health   Aim for 150 min of moderate intensity exercise weekly for heart health, and weights twice weekly for bone health   Aim for 7-9 hours of sleep daily   When it comes to diets, agreement about the perfect plan isnt easy to find, even among the experts. Experts at the [employer] developed an idea known as the Healthy Eating Plate. Just imagine a plate divided into logical, healthy portions.   The emphasis is on diet quality:   Load up on vegetables and fruits - one-half of your plate: Aim for color and variety, and remember that  potatoes dont count.   Go for whole grains - one-quarter of your plate: Whole wheat, barley, wheat berries, quinoa, oats, brown rice, and foods made with them. If you want pasta, go with whole wheat pasta.   Protein power - one-quarter of your plate: Fish, chicken, beans, and nuts are all healthy, versatile protein sources. Limit red meat.   The diet, however, does go beyond the plate, offering a few other suggestions.   Use healthy plant oils, such as olive, canola, soy, corn, sunflower and peanut. Check the labels, and avoid partially hydrogenated oil, which have unhealthy trans fats.   If youre thirsty, drink water. Coffee and tea are good in moderation, but skip sugary drinks and limit milk and dairy products to one or two daily servings.   The type of carbohydrate in the diet is more important than the amount. Some sources of carbohydrates, such as vegetables, fruits, whole grains, and beans-are healthier than others.   Finally, stay active  Signed, Thomasene RippleKardie Isrrael Fluckiger, DO  07/18/2019 10:32 PM    [facility]

## 2019-08-20 ENCOUNTER — Ambulatory Visit: Payer: BC Managed Care – PPO | Admitting: Cardiology

## 2019-09-07 ENCOUNTER — Other Ambulatory Visit: Payer: Self-pay | Admitting: Orthopaedic Surgery

## 2019-09-19 NOTE — Patient Instructions (Addendum)
DUE TO COVID-19 ONLY ONE VISITOR IS ALLOWED TO COME WITH YOU AND STAY IN THE WAITING ROOM ONLY DURING PRE OP AND PROCEDURE DAY OF SURGERY. THE 1 VISITOR MAY VISIT WITH YOU AFTER SURGERY IN YOUR PRIVATE ROOM DURING VISITING HOURS ONLY!  YOU NEED TO HAVE A COVID 19 TEST ON__12/31/20_____ @_9 :00______, THIS TEST MUST BE DONE BEFORE SURGERY, COME  [address], [place] , [phone].  [facility] ONCE YOUR COVID TEST IS COMPLETED, PLEASE BEGIN THE QUARANTINE INSTRUCTIONS AS OUTLINED IN YOUR HANDOUT.                Catherine Newman    Your procedure is scheduled on: 10/01/18   Report to [facility] Main  Entrance   Report to admitting at  7:40 AM     Call this number if you have problems the morning of surgery [phone]     BRUSH YOUR TEETH MORNING OF SURGERY AND RINSE YOUR MOUTH OUT, NO CHEWING GUM CANDY OR MINTS.   Do not eat food After Midnight.   YOU MAY HAVE CLEAR LIQUIDS FROM MIDNIGHT UNTIL 7:00 AM    CLEAR LIQUID DIET   Foods Allowed                                                                     Foods Excluded  Coffee and tea, regular and decaf                             liquids that you cannot  Plain Jell-O any favor except red or purple                                           see through such as: Fruit ices (not with fruit pulp)                                     milk, soups, orange juice  Iced Popsicles                                    All solid food Carbonated beverages, regular and diet                                    Cranberry, grape and apple juices Sports drinks like Gatorade Lightly seasoned clear broth or consume(fat free) Sugar, honey syrup                             . At 7:00 AM Please finish the prescribed Pre-Surgery Gatorade drink.   Nothing by mouth after you finish the Gatorade drink !    Take these medicines the morning of surgery with A SIP OF WATER: none  DO NOT TAKE ANY DIABETIC MEDICATIONS DAY OF YOUR  SURGERY    How to Manage Your  Diabetes Before and After Surgery  Why is it important to control my blood sugar before and after surgery? . Improving blood sugar levels before and after surgery helps healing and can limit problems. . A way of improving blood sugar control is eating a healthy diet by: o  Eating less sugar and carbohydrates o  Increasing activity/exercise o  Talking with your doctor about reaching your blood sugar goals . High blood sugars (greater than 180 mg/dL) can raise your risk of infections and slow your recovery, so you will need to focus on controlling your diabetes during the weeks before surgery. . Make sure that the doctor who takes care of your diabetes knows about your planned surgery including the date and location.  How do I manage my blood sugar before surgery? . Check your blood sugar at least 4 times a day, starting 2 days before surgery, to make sure that the level is not too high or low. o Check your blood sugar the morning of your surgery when you wake up and every 2 hours until you get to the Short Stay unit. . If your blood sugar is less than 70 mg/dL, you will need to treat for low blood sugar: o Do not take insulin. o Treat a low blood sugar (less than 70 mg/dL) with  cup of clear juice (cranberry or apple), 4 glucose tablets, OR glucose gel. o Recheck blood sugar in 15 minutes after treatment (to make sure it is greater than 70 mg/dL). If your blood sugar is not greater than 70 mg/dL on recheck, call [phone] for further instructions. . Report your blood sugar to the short stay nurse when you get to Short Stay.  . If you are admitted to the hospital after surgery: o Your blood sugar will be checked by the staff and you will probably be given insulin after surgery (instead of oral diabetes medicines) to make sure you have good blood sugar levels. o The goal for blood sugar control after surgery is 80-180 mg/dL.   WHAT DO I DO ABOUT MY DIABETES  MEDICATION?  Marland Kitchen Do not take oral diabetes medicines (pills) the morning of surgery.  . THE NIGHT BEFORE SURGERY, take 17  units of  Glargine  insulin.  (50% of usual dose)     . THE MORNING OF SURGERY, take15  units of Glargine  insulin.  . The day of surgery, do not take other diabetes injectables, including Byetta (exenatide), Bydureon (exenatide ER), Victoza (liraglutide), or Trulicity (dulaglutide).  .              You may not have any metal on your body including hair pins and              piercings  Do not wear jewelry, make-up, lotions, powders or perfumes, deodorant             Do not wear nail polish on your fingernails.  Do not shave  48 hours prior to surgery.           Do not bring valuables to the hospital. [facility] IS NOT             RESPONSIBLE   FOR VALUABLES.  Contacts, dentures or bridgework may not be worn into surgery. .     Patients discharged the day of surgery will not be allowed to drive home.  IF YOU ARE HAVING SURGERY AND GOING HOME THE SAME DAY, YOU MUST HAVE AN ADULT TO DRIVE YOU HOME  AND BE WITH YOU FOR 24 HOURS.  YOU MAY GO HOME BY TAXI OR UBER OR ORTHERWISE, BUT AN ADULT MUST ACCOMPANY YOU HOME AND STAY WITH YOU FOR 24 HOURS.  Name and phone number of your driver:  Special Instructions: N/A              Please read over the following fact sheets you were given: _____________________________________________________________________             [facility] - Preparing for Surgery  Before surgery, you can play an important role.   Because skin is not sterile, your skin needs to be as free of germs as possible.   You can reduce the number of germs on your skin by washing with CHG (chlorahexidine gluconate) soap before surgery.   CHG is an antiseptic cleaner which kills germs and bonds with the skin to continue killing germs even after washing. Please DO NOT use if you have an allergy to CHG or antibacterial soaps.   If your skin becomes  reddened/irritated stop using the CHG and inform your nurse when you arrive at Short Stay. Do not shave (including legs and underarms) for at least 48 hours prior to the first CHG shower.   Please follow these instructions carefully:  1.  Shower with CHG Soap the night before surgery and the  morning of Surgery.  2.  If you choose to wash your hair, wash your hair first as usual with your  normal  shampoo.  3.  After you shampoo, rinse your hair and body thoroughly to remove the  shampoo.                                        4.  Use CHG as you would any other liquid soap.  You can apply chg directly  to the skin and wash                       Gently with a scrungie or clean washcloth.  5.  Apply the CHG Soap to your body ONLY FROM THE NECK DOWN.   Do not use on face/ open                           Wound or open sores. Avoid contact with eyes, ears mouth and genitals (private parts).                       Wash face,  Genitals (private parts) with your normal soap.             6.  Wash thoroughly, paying special attention to the area where your surgery  will be performed.  7.  Thoroughly rinse your body with warm water from the neck down.  8.  DO NOT shower/wash with your normal soap after using and rinsing off  the CHG Soap.             9.  Pat yourself dry with a clean towel.            10.  Wear clean pajamas.            11.  Place clean sheets on your bed the night of your first shower and do not  sleep with pets. Day of Surgery : Do not apply any lotions/deodorants the  morning of surgery.  Please wear clean clothes to the hospital/surgery center.  FAILURE TO FOLLOW THESE INSTRUCTIONS MAY RESULT IN THE CANCELLATION OF YOUR SURGERY PATIENT SIGNATURE_________________________________  NURSE SIGNATURE__________________________________  ________________________________________________________________________   Catherine Newman  An incentive spirometer is a tool that can help keep  your lungs clear and active. This tool measures how well you are filling your lungs with each breath. Taking long deep breaths may help reverse or decrease the chance of developing breathing (pulmonary) problems (especially infection) following:  A long period of time when you are unable to move or be active. BEFORE THE PROCEDURE   If the spirometer includes an indicator to show your best effort, your nurse or respiratory therapist will set it to a desired goal.  If possible, sit up straight or lean slightly forward. Try not to slouch.  Hold the incentive spirometer in an upright position. INSTRUCTIONS FOR USE  1. Sit on the edge of your bed if possible, or sit up as far as you can in bed or on a chair. 2. Hold the incentive spirometer in an upright position. 3. Breathe out normally. 4. Place the mouthpiece in your mouth and seal your lips tightly around it. 5. Breathe in slowly and as deeply as possible, raising the piston or the ball toward the top of the column. 6. Hold your breath for 3-5 seconds or for as long as possible. Allow the piston or ball to fall to the bottom of the column. 7. Remove the mouthpiece from your mouth and breathe out normally. 8. Rest for a few seconds and repeat Steps 1 through 7 at least 10 times every 1-2 hours when you are awake. Take your time and take a few normal breaths between deep breaths. 9. The spirometer may include an indicator to show your best effort. Use the indicator as a goal to work toward during each repetition. 10. After each set of 10 deep breaths, practice coughing to be sure your lungs are clear. If you have an incision (the cut made at the time of surgery), support your incision when coughing by placing a pillow or rolled up towels firmly against it. Once you are able to get out of bed, walk around indoors and cough well. You may stop using the incentive spirometer when instructed by your caregiver.  RISKS AND COMPLICATIONS  Take your time  so you do not get dizzy or light-headed.  If you are in pain, you may need to take or ask for pain medication before doing incentive spirometry. It is harder to take a deep breath if you are having pain. AFTER USE  Rest and breathe slowly and easily.  It can be helpful to keep track of a log of your progress. Your caregiver can provide you with a simple table to help with this. If you are using the spirometer at home, follow these instructions: [place] IF:   You are having difficultly using the spirometer.  You have trouble using the spirometer as often as instructed.  Your pain medication is not giving enough relief while using the spirometer.  You develop fever of 100.5 F (38.1 C) or higher. SEEK IMMEDIATE MEDICAL CARE IF:   You cough up bloody sputum that had not been present before.  You develop fever of 102 F (38.9 C) or greater.  You develop worsening pain at or near the incision site. MAKE SURE YOU:   Understand these instructions.  Will watch your condition.  Will get help right  away if you are not doing well or get worse. Document Released: 01/24/2007 Document Revised: 12/06/2011 Document Reviewed: 03/27/2007 [facility] Patient Information 2014 [place], [place].   ________________________________________________________________________

## 2019-09-24 ENCOUNTER — Other Ambulatory Visit: Payer: Self-pay

## 2019-09-24 ENCOUNTER — Encounter (HOSPITAL_COMMUNITY): Payer: Self-pay

## 2019-09-24 ENCOUNTER — Encounter (HOSPITAL_COMMUNITY)
Admission: RE | Admit: 2019-09-24 | Discharge: 2019-09-24 | Disposition: A | Discharge status: Home / Self Care | Payer: No Typology Code available for payment source | Source: Ambulatory Visit | Attending: Orthopaedic Surgery | Admitting: Orthopaedic Surgery

## 2019-09-24 DIAGNOSIS — Z01818 Encounter for other preprocedural examination: Secondary | ICD-10-CM | POA: Diagnosis present

## 2019-09-24 DIAGNOSIS — Z794 Long term (current) use of insulin: Secondary | ICD-10-CM | POA: Diagnosis not present

## 2019-09-24 DIAGNOSIS — M1711 Unilateral primary osteoarthritis, right knee: Secondary | ICD-10-CM | POA: Diagnosis not present

## 2019-09-24 DIAGNOSIS — Z7901 Long term (current) use of anticoagulants: Secondary | ICD-10-CM | POA: Diagnosis not present

## 2019-09-24 DIAGNOSIS — I10 Essential (primary) hypertension: Secondary | ICD-10-CM | POA: Diagnosis not present

## 2019-09-24 DIAGNOSIS — Z87891 Personal history of nicotine dependence: Secondary | ICD-10-CM | POA: Diagnosis not present

## 2019-09-24 DIAGNOSIS — E118 Type 2 diabetes mellitus with unspecified complications: Secondary | ICD-10-CM | POA: Diagnosis not present

## 2019-09-24 DIAGNOSIS — Z79899 Other long term (current) drug therapy: Secondary | ICD-10-CM | POA: Diagnosis not present

## 2019-09-24 NOTE — Progress Notes (Signed)
PCP - Dr. Jamal Collin Cardiologist - Dr. Britt Boozer  Chest x-ray - will be done 09/26/19 EKG - 06/12/19 Stress Test - 07/17/19 ECHO - 07/13/19 Cardiac Cath - no  Sleep Study - no CPAP -   Fasting Blood Sugar - 108-115 Checks Blood Sugar _QD____ times a day  Blood Thinner Instructions:NA Aspirin Instructions: Last Dose:  Anesthesia review:   Patient denies shortness of breath, fever, cough and chest pain at PAT appointment yes  Patient verbalized understanding of instructions that were given to them at the PAT appointment. Patient was also instructed that they will need to review over the PAT instructions again at home before surgery. Yes Pt stopped taking Norvasc and Coreg because they were making her too sleepy in the afternoons. Pt states that lisinopril has been managing her blood pressure fairly well. I told her to monitor her BP at home and if it is climbing to call her PCP. She had a surgery cancelled in the past due to HTN.

## 2019-09-26 ENCOUNTER — Encounter (HOSPITAL_COMMUNITY)
Admission: RE | Admit: 2019-09-26 | Discharge: 2019-09-26 | Disposition: A | Discharge status: Home / Self Care | Payer: No Typology Code available for payment source | Source: Ambulatory Visit | Attending: Orthopaedic Surgery | Admitting: Orthopaedic Surgery

## 2019-09-26 ENCOUNTER — Ambulatory Visit (HOSPITAL_COMMUNITY)
Admission: RE | Admit: 2019-09-26 | Discharge: 2019-09-26 | Disposition: A | Discharge status: Home / Self Care | Payer: No Typology Code available for payment source | Source: Ambulatory Visit | Attending: Orthopaedic Surgery | Admitting: Orthopaedic Surgery

## 2019-09-26 ENCOUNTER — Other Ambulatory Visit: Payer: Self-pay

## 2019-09-26 DIAGNOSIS — Z87891 Personal history of nicotine dependence: Secondary | ICD-10-CM | POA: Insufficient documentation

## 2019-09-26 DIAGNOSIS — Z01818 Encounter for other preprocedural examination: Secondary | ICD-10-CM | POA: Diagnosis not present

## 2019-09-26 DIAGNOSIS — I10 Essential (primary) hypertension: Secondary | ICD-10-CM | POA: Insufficient documentation

## 2019-09-26 DIAGNOSIS — M1711 Unilateral primary osteoarthritis, right knee: Secondary | ICD-10-CM | POA: Insufficient documentation

## 2019-09-26 DIAGNOSIS — Z794 Long term (current) use of insulin: Secondary | ICD-10-CM | POA: Insufficient documentation

## 2019-09-26 DIAGNOSIS — Z79899 Other long term (current) drug therapy: Secondary | ICD-10-CM | POA: Insufficient documentation

## 2019-09-26 DIAGNOSIS — E118 Type 2 diabetes mellitus with unspecified complications: Secondary | ICD-10-CM | POA: Insufficient documentation

## 2019-09-26 DIAGNOSIS — Z7901 Long term (current) use of anticoagulants: Secondary | ICD-10-CM | POA: Insufficient documentation

## 2019-09-26 LAB — CBC WITH DIFFERENTIAL/PLATELET
Abs Immature Granulocytes: 0.02 10*3/uL (ref 0.00–0.07)
Basophils Absolute: 0 10*3/uL (ref 0.0–0.1)
Basophils Relative: 1 %
Eosinophils Absolute: 0.2 10*3/uL (ref 0.0–0.5)
Eosinophils Relative: 4 %
HCT: 42.8 % (ref 36.0–46.0)
Hemoglobin: 13.3 g/dL (ref 12.0–15.0)
Immature Granulocytes: 0 %
Lymphocytes Relative: 28 %
Lymphs Abs: 1.7 10*3/uL (ref 0.7–4.0)
MCH: 27.5 pg (ref 26.0–34.0)
MCHC: 31.1 g/dL (ref 30.0–36.0)
MCV: 88.4 fL (ref 80.0–100.0)
Monocytes Absolute: 0.3 10*3/uL (ref 0.1–1.0)
Monocytes Relative: 5 %
Neutro Abs: 3.8 10*3/uL (ref 1.7–7.7)
Neutrophils Relative %: 62 %
Platelets: 238 10*3/uL (ref 150–400)
RBC: 4.84 MIL/uL (ref 3.87–5.11)
RDW: 14.2 % (ref 11.5–15.5)
WBC: 6.2 10*3/uL (ref 4.0–10.5)
nRBC: 0 % (ref 0.0–0.2)

## 2019-09-26 LAB — BASIC METABOLIC PANEL
Anion gap: 11 (ref 5–15)
BUN: 19 mg/dL (ref 6–20)
CO2: 23 mmol/L (ref 22–32)
Calcium: 9 mg/dL (ref 8.9–10.3)
Chloride: 107 mmol/L (ref 98–111)
Creatinine, Ser: 0.7 mg/dL (ref 0.44–1.00)
GFR calc Af Amer: >60 mL/min (ref >60)
GFR calc non Af Amer: >60 mL/min (ref >60)
Glucose, Bld: 174 mg/dL — ABNORMAL HIGH (ref 70–99)
Potassium: 4.2 mmol/L (ref 3.5–5.1)
Sodium: 141 mmol/L (ref 135–145)

## 2019-09-26 LAB — URINALYSIS, ROUTINE W REFLEX MICROSCOPIC
Bilirubin Urine: NEGATIVE
Glucose, UA: NEGATIVE mg/dL
Hgb urine dipstick: NEGATIVE
Ketones, ur: NEGATIVE mg/dL
Nitrite: NEGATIVE
Protein, ur: NEGATIVE mg/dL
Specific Gravity, Urine: 1.016 (ref 1.005–1.030)
pH: 5 (ref 5.0–8.0)

## 2019-09-26 LAB — SURGICAL PCR SCREEN
MRSA, PCR: NEGATIVE
Staphylococcus aureus: NEGATIVE

## 2019-09-26 LAB — HEMOGLOBIN A1C
Hgb A1c MFr Bld: 7.3 % — ABNORMAL HIGH (ref 4.8–5.6)
Mean Plasma Glucose: 162.81 mg/dL

## 2019-09-26 LAB — GLUCOSE, CAPILLARY: Glucose-Capillary: 151 mg/dL — ABNORMAL HIGH (ref 70–99)

## 2019-09-26 LAB — PROTIME-INR
INR: 1 (ref 0.8–1.2)
Prothrombin Time: 12.6 seconds (ref 11.4–15.2)

## 2019-09-26 LAB — APTT: aPTT: 30 seconds (ref 24–36)

## 2019-09-26 NOTE — Progress Notes (Signed)
Anesthesia Chart Review   Case: [id number] Date/Time: 10/02/19 0954   Procedure: RIGHT TOTAL KNEE ARTHROPLASTY (Right Knee)   Anesthesia type: Spinal   Pre-op diagnosis: RIGHT KNEE DEGENERATIVE JOINT DISEASE   Location: [facility] ROOM 06 / [facility]   Surgeons: Melrose Nakayama, MD      DISCUSSION:53 y.o. former smoker (quit 09/27/96) with h/o PONV, DM II, HTN, right knee djd scheduled for above procedure 10/02/2019 with Dr. Melrose Nakayama.   Pt last seen by cardiologist 07/18/2019.  Per OV note, "Given her normal stress test.  The patient does not have any unstable cardiac conditions.  Upon evaluation today, she can achieve 4 METs or greater without anginal symptoms.  According to [facility] and AHA guidelines, she requires no further cardiac workup prior to her noncardiac surgery (right knee surgery) and should be at acceptable risk."  Anticipate pt can proceed with planned procedure barring acute status change.   VS: BP (!) 150/69   Pulse 79   Temp 36.9 C (Oral)   Resp 16   Ht 5\' 6"  (1.676 m)   Wt 98 kg   LMP 05/22/2018 (Approximate) Comment: having cycle once every 3-4 months  SpO2 100%   BMI 34.86 kg/m   PROVIDERS: Physicians, Lewis is PCP   Domingo Pulse, DO is Cardiologist  LABS: Labs reviewed: Acceptable for surgery. (all labs ordered are listed, but only abnormal results are displayed)  Labs Reviewed  BASIC METABOLIC PANEL - Abnormal; Notable for the following components:      Result Value   Glucose, Bld 174 (*)    All other components within normal limits  URINALYSIS, ROUTINE W REFLEX MICROSCOPIC - Abnormal; Notable for the following components:   Leukocytes,Ua SMALL (*)    Bacteria, UA RARE (*)    Crystals PRESENT (*)    All other components within normal limits  HEMOGLOBIN A1C - Abnormal; Notable for the following components:   Hgb A1c MFr Bld 7.3 (*)    All other components within normal limits  GLUCOSE, CAPILLARY - Abnormal; Notable for the following components:    Glucose-Capillary 151 (*)    All other components within normal limits  SURGICAL PCR SCREEN  APTT  CBC WITH DIFFERENTIAL/PLATELET  PROTIME-INR  TYPE AND SCREEN     IMAGES:   EKG: 06/12/2019 Rate 64 bpm Normal sinus rhythm   CV: Myocardial Perfusion 07/17/2019  The left ventricular ejection fraction is hyperdynamic (>65%).  Nuclear stress EF: 70%.  Defect 1: There is a small defect of mild severity present in the mid anterior and apical anterior location.  The study is normal.  This is a low risk study.   The small fixed perfusion defect associated with normal wall motion is likely due to soft tissue attenuation.  Echo 07/13/2019 IMPRESSIONS    1. Left ventricular ejection fraction, by visual estimation, is 60 to 65%. The left ventricle has normal function. Normal left ventricular size. There is no left ventricular hypertrophy.  2. Global right ventricle has normal systolic function.The right ventricular size is normal. No increase in right ventricular wall thickness.  3. Left atrial size was normal.  4. Right atrial size was normal.  5. The mitral valve is normal in structure. No evidence of mitral valve regurgitation. No evidence of mitral stenosis.  6. The tricuspid valve is normal in structure. Tricuspid valve regurgitation was not visualized by color flow Doppler.  7. The aortic valve is normal in structure. Aortic valve regurgitation was not visualized by color flow  Doppler. Structurally normal aortic valve, with no evidence of sclerosis or stenosis.  8. The pulmonic valve was normal in structure. Pulmonic valve regurgitation is not visualized by color flow Doppler.  9. The inferior vena cava is normal in size with greater than 50% respiratory variability, suggesting right atrial pressure of 3 mmHg. Past Medical History:  Diagnosis Date  . Complication of anesthesia    Pt is sensitive to egg based medications.  . Diabetes mellitus without complication (HCC)    . Hypertension   . Inappropriate lactation    Occasional L breast  . Migraine    migraines  . Miscarriage    x 6  . Pneumonia 04/2019  . PONV (postoperative nausea and vomiting)     Past Surgical History:  Procedure Laterality Date  . DILATION AND CURETTAGE OF UTERUS    . ECTOPIC PREGNANCY SURGERY    . KNEE ARTHROSCOPY Right   . SALPINGOOPHORECTOMY Right     MEDICATIONS: . amLODipine (NORVASC) 2.5 MG tablet  . carvedilol (COREG) 3.125 MG tablet  . ibuprofen (ADVIL) 200 MG tablet  . Insulin Glargine (BASAGLAR KWIKPEN) 100 UNIT/ML SOPN  . lisinopril (ZESTRIL) 30 MG tablet  . Melatonin 5 MG TABS  . metFORMIN (GLUCOPHAGE) 500 MG tablet  . traMADol (ULTRAM) 50 MG tablet   No current facility-administered medications for this encounter.   . gadopentetate dimeglumine (MAGNEVIST) injection 20 mL  . gadopentetate dimeglumine (MAGNEVIST) injection 20 mL     Janey Genta [facility] Pre-Surgical Testing [phone] 09/26/19  4:17 PM

## 2019-09-26 NOTE — Anesthesia Preprocedure Evaluation (Addendum)
Anesthesia Evaluation  Patient identified by MRN, date of birth, ID band Patient awake    Reviewed: Allergy & Precautions, NPO status , Patient's Chart, lab work & pertinent test results, reviewed documented beta blocker date and time   History of Anesthesia Complications (+) PONV and history of anesthetic complications  Airway Mallampati: II  TM Distance: >3 FB Neck ROM: Full    Dental no notable dental hx. (+) Teeth Intact   Pulmonary pneumonia, resolved, former smoker,    Pulmonary exam normal breath sounds clear to auscultation       Cardiovascular hypertension, Pt. on medications and Pt. on home beta blockers Normal cardiovascular exam Rhythm:Regular Rate:Normal     Neuro/Psych  Headaches, negative psych ROS   GI/Hepatic negative GI ROS, Neg liver ROS,   Endo/Other  diabetes, Well Controlled, Type 2, Insulin Dependent, Oral Hypoglycemic AgentsMorbid obesity  Renal/GU negative Renal ROS  negative genitourinary   Musculoskeletal  (+) Arthritis , Osteoarthritis,  Right Knee DJD   Abdominal (+) + obese,   Peds  Hematology negative hematology ROS (+)   Anesthesia Other Findings   Reproductive/Obstetrics                           Anesthesia Physical Anesthesia Plan  ASA: III  Anesthesia Plan: Spinal   Post-op Pain Management:  Regional for Post-op pain   Induction: Intravenous  PONV Risk Score and Plan: 3 and Midazolam, Scopolamine patch - Pre-op, Ondansetron, Treatment may vary due to age or medical condition and Propofol infusion  Airway Management Planned: Natural Airway, Nasal Cannula and Simple Face Mask  Additional Equipment:   Intra-op Plan:   Post-operative Plan:   Informed Consent: I have reviewed the patients History and Physical, chart, labs and discussed the procedure including the risks, benefits and alternatives for the proposed anesthesia with the patient or  authorized representative who has indicated his/her understanding and acceptance.     Dental advisory given  Plan Discussed with: CRNA and Surgeon  Anesthesia Plan Comments: (See PAT note 09/26/2019, Konrad Felix, PA-C)      Anesthesia Quick Evaluation

## 2019-09-26 NOTE — Progress Notes (Signed)
Attempted to contact patient regarding rescheduling COVID test within appropriate time frame for surgery. Voicemail left with callback number.   Jacqlyn Larsen, RN

## 2019-09-27 ENCOUNTER — Inpatient Hospital Stay (HOSPITAL_COMMUNITY)
Admission: RE | Admit: 2019-09-27 | Discharge: 2019-09-27 | Disposition: A | Discharge status: Home / Self Care | Payer: BC Managed Care – PPO | Source: Ambulatory Visit

## 2019-09-29 ENCOUNTER — Other Ambulatory Visit (HOSPITAL_COMMUNITY)
Admission: RE | Admit: 2019-09-29 | Discharge: 2019-09-29 | Disposition: A | Discharge status: Home / Self Care | Payer: No Typology Code available for payment source | Source: Ambulatory Visit | Attending: Orthopaedic Surgery | Admitting: Orthopaedic Surgery

## 2019-09-29 DIAGNOSIS — Z01818 Encounter for other preprocedural examination: Secondary | ICD-10-CM | POA: Diagnosis not present

## 2019-09-29 LAB — SARS CORONAVIRUS 2 (TAT 6-24 HRS): SARS Coronavirus 2: NEGATIVE

## 2019-10-01 MED ORDER — TRANEXAMIC ACID 1000 MG/10ML IV SOLN
2000.0000 mg | INTRAVENOUS | Status: DC
  Filled 2019-10-01: qty 20

## 2019-10-01 MED ORDER — BUPIVACAINE LIPOSOME 1.3 % IJ SUSP
20.0000 mL | Freq: Once | INTRAMUSCULAR | Status: DC
  Filled 2019-10-01: qty 20

## 2019-10-01 NOTE — H&P (Signed)
TOTAL KNEE ADMISSION H&P  Patient is being admitted for right total knee arthroplasty.  Subjective:  Chief Complaint:right knee pain.  HPI: Catherine Newman, 54 y.o. female, has a history of pain and functional disability in the right knee due to arthritis and has failed non-surgical conservative treatments for greater than 12 weeks to includeNSAID's and/or analgesics, corticosteriod injections, viscosupplementation injections, flexibility and strengthening excercises, supervised PT with diminished ADL's post treatment, use of assistive devices, weight reduction as appropriate and activity modification.  Onset of symptoms was gradual, starting 5 years ago with gradually worsening course since that time. The patient noted prior procedures on the knee to include  arthroscopy on the right knee(s).  Patient currently rates pain in the right knee(s) at 10 out of 10 with activity. Patient has night pain, worsening of pain with activity and weight bearing, pain that interferes with activities of daily living, crepitus and joint swelling.  Patient has evidence of subchondral cysts, subchondral sclerosis, periarticular osteophytes and joint space narrowing by imaging studies.There is no active infection.  Patient Active Problem List   Diagnosis Date Noted  . Right homonymous hemianopsia 02/14/2017  . Essential hypertension 02/14/2017  . Weight loss 02/14/2017  . Myalgia 02/13/2017  . Chronic pain of multiple joints 02/13/2017  . Type 2 diabetes mellitus without complication, without long-term current use of insulin (HCC) 02/13/2017  . Chest pain 07/24/2015   Past Medical History:  Diagnosis Date  . Complication of anesthesia    Pt is sensitive to egg based medications.  . Diabetes mellitus without complication (HCC)   . Hypertension   . Inappropriate lactation    Occasional L breast  . Migraine    migraines  . Miscarriage    x 6  . Pneumonia 04/2019  . PONV (postoperative nausea and vomiting)      Past Surgical History:  Procedure Laterality Date  . DILATION AND CURETTAGE OF UTERUS    . ECTOPIC PREGNANCY SURGERY    . KNEE ARTHROSCOPY Right   . SALPINGOOPHORECTOMY Right     Current Facility-Administered Medications  Medication Dose Route Frequency Provider Last Rate Last Admin  . [START ON 10/02/2019] bupivacaine liposome (EXPAREL) 1.3 % injection 266 mg  20 mL Other Once Marcene Corning, MD      . Melene Muller ON 10/02/2019] tranexamic acid (CYKLOKAPRON) 2,000 mg in sodium chloride 0.9 % 50 mL Topical Application  2,000 mg Topical To OR Marcene Corning, MD       Current Outpatient Medications  Medication Sig Dispense Refill Last Dose  . ibuprofen (ADVIL) 200 MG tablet Take 800 mg by mouth every 8 (eight) hours as needed for moderate pain.      . Insulin Glargine (BASAGLAR KWIKPEN) 100 UNIT/ML SOPN Inject 32 Units into the skin 2 (two) times daily.      Marland Kitchen lisinopril (ZESTRIL) 30 MG tablet Take 30 mg by mouth daily.      . Melatonin 5 MG TABS Take 5 mg by mouth at bedtime.      . metFORMIN (GLUCOPHAGE) 500 MG tablet Take 500 mg by mouth 2 (two) times daily.      . traMADol (ULTRAM) 50 MG tablet Take 50 mg by mouth 2 (two) times daily.      Marland Kitchen amLODipine (NORVASC) 2.5 MG tablet Take 1 tablet (2.5 mg total) by mouth daily. (Patient not taking: Reported on 09/17/2019) 90 tablet 1 Not Taking at Unknown time  . carvedilol (COREG) 3.125 MG tablet Take 1 tablet (3.125 mg total) by mouth  2 (two) times daily. (Patient not taking: Reported on 09/17/2019) 180 tablet 1 Not Taking at Unknown time   Facility-Administered Medications Ordered in Other Encounters  Medication Dose Route Frequency Provider Last Rate Last Admin  . gadopentetate dimeglumine (MAGNEVIST) injection 20 mL  20 mL Intravenous Once PRN Naomie Dean B, MD      . gadopentetate dimeglumine (MAGNEVIST) injection 20 mL  20 mL Intravenous Once PRN Anson Fret, MD       No Known Allergies  Social History   Tobacco Use  . Smoking  status: Former Smoker    Types: Cigarettes    Quit date: 1998    Years since quitting: 23.0  . Smokeless tobacco: Never Used  Substance Use Topics  . Alcohol use: No    Comment: Quit 1994    Family History  Problem Relation Age of Onset  . Leukemia Mother   . Leukemia Father   . Endometriosis Sister   . Stroke Maternal Grandmother   . Lung cancer Maternal Grandfather   . Cancer Paternal Aunt   . Diabetes Maternal Aunt   . Diabetes Maternal Uncle   . Vision loss Neg Hx      Review of Systems  Musculoskeletal: Positive for arthralgias.       Right knee  All other systems reviewed and are negative.   Objective:  Physical Exam  Constitutional: She is oriented to person, place, and time. She appears well-developed and well-nourished.  HENT:  Head: Normocephalic and atraumatic.  Eyes: Pupils are equal, round, and reactive to light.  Cardiovascular: Normal rate and regular rhythm.  Respiratory: Effort normal.  GI: Soft.  Musculoskeletal:     Cervical back: Normal range of motion.     Comments: Right knee motion remains about 0-110.  There is no effusion.  She has good ligamentous integrity.  She has medial greater than lateral pain.  Hip motion is full and straight leg raise is negative.  Neurological: She is alert and oriented to person, place, and time.  Skin: Skin is warm and dry.  Psychiatric: She has a normal mood and affect. Her behavior is normal. Judgment and thought content normal.    Vital signs in last 24 hours:    Labs:   Estimated body mass index is 34.86 kg/m as calculated from the following:   Height as of 09/26/19: 5\' 6"  (1.676 m).   Weight as of 09/26/19: 98 kg.   Imaging Review Plain radiographs demonstrate severe degenerative joint disease of the right knee(s). The overall alignment isneutral. The bone quality appears to be good for age and reported activity level.      Assessment/Plan:  End stage primary arthritis, right knee   The  patient history, physical examination, clinical judgment of the provider and imaging studies are consistent with end stage degenerative joint disease of the right knee(s) and total knee arthroplasty is deemed medically necessary. The treatment options including medical management, injection therapy arthroscopy and arthroplasty were discussed at length. The risks and benefits of total knee arthroplasty were presented and reviewed. The risks due to aseptic loosening, infection, stiffness, patella tracking problems, thromboembolic complications and other imponderables were discussed. The patient acknowledged the explanation, agreed to proceed with the plan and consent was signed. Patient is being admitted for inpatient treatment for surgery, pain control, PT, OT, prophylactic antibiotics, VTE prophylaxis, progressive ambulation and ADL's and discharge planning. The patient is planning to be discharged home with home health services   Patient's anticipated LOS is  less than 2 midnights, meeting these requirements: - Younger than 48 - Lives within 1 hour of care - Has a competent adult at home to recover with post-op recover - NO history of  - Chronic pain requiring opiods  - Diabetes  - Coronary Artery Disease  - Heart failure  - Heart attack  - Stroke  - DVT/VTE  - Cardiac arrhythmia  - Respiratory Failure/COPD  - Renal failure  - Anemia  - Advanced Liver disease

## 2019-10-01 NOTE — Care Plan (Signed)
This patient is under worker's comp and they have made all post acute arrangements for her including therapy and equipment. She may need an overnight stay due to co morbidities.   Shauna Hugh, RNCM  [phone]

## 2019-10-02 ENCOUNTER — Ambulatory Visit (HOSPITAL_COMMUNITY): Payer: No Typology Code available for payment source | Admitting: Physician Assistant

## 2019-10-02 ENCOUNTER — Encounter (HOSPITAL_COMMUNITY): Payer: Self-pay | Admitting: Orthopaedic Surgery

## 2019-10-02 ENCOUNTER — Observation Stay (HOSPITAL_COMMUNITY)
Admission: RE | Admit: 2019-10-02 | Discharge: 2019-10-02 | Disposition: A | Discharge status: Home / Self Care | Payer: No Typology Code available for payment source | Attending: Orthopaedic Surgery | Admitting: Orthopaedic Surgery

## 2019-10-02 ENCOUNTER — Ambulatory Visit (HOSPITAL_COMMUNITY): Payer: No Typology Code available for payment source | Admitting: Certified Registered Nurse Anesthetist

## 2019-10-02 ENCOUNTER — Encounter (HOSPITAL_COMMUNITY)
Admission: RE | Disposition: A | Discharge status: Home / Self Care | Payer: Self-pay | Source: Home / Self Care | Attending: Orthopaedic Surgery

## 2019-10-02 ENCOUNTER — Other Ambulatory Visit: Payer: Self-pay

## 2019-10-02 DIAGNOSIS — Z801 Family history of malignant neoplasm of trachea, bronchus and lung: Secondary | ICD-10-CM | POA: Insufficient documentation

## 2019-10-02 DIAGNOSIS — Z87891 Personal history of nicotine dependence: Secondary | ICD-10-CM | POA: Insufficient documentation

## 2019-10-02 DIAGNOSIS — I10 Essential (primary) hypertension: Secondary | ICD-10-CM | POA: Insufficient documentation

## 2019-10-02 DIAGNOSIS — Z806 Family history of leukemia: Secondary | ICD-10-CM | POA: Insufficient documentation

## 2019-10-02 DIAGNOSIS — Z90721 Acquired absence of ovaries, unilateral: Secondary | ICD-10-CM | POA: Diagnosis not present

## 2019-10-02 DIAGNOSIS — M1711 Unilateral primary osteoarthritis, right knee: Secondary | ICD-10-CM | POA: Diagnosis present

## 2019-10-02 DIAGNOSIS — G43909 Migraine, unspecified, not intractable, without status migrainosus: Secondary | ICD-10-CM | POA: Diagnosis not present

## 2019-10-02 DIAGNOSIS — Z823 Family history of stroke: Secondary | ICD-10-CM | POA: Insufficient documentation

## 2019-10-02 DIAGNOSIS — H53461 Homonymous bilateral field defects, right side: Secondary | ICD-10-CM | POA: Diagnosis not present

## 2019-10-02 DIAGNOSIS — Z794 Long term (current) use of insulin: Secondary | ICD-10-CM | POA: Diagnosis not present

## 2019-10-02 DIAGNOSIS — Z79899 Other long term (current) drug therapy: Secondary | ICD-10-CM | POA: Insufficient documentation

## 2019-10-02 DIAGNOSIS — Z791 Long term (current) use of non-steroidal anti-inflammatories (NSAID): Secondary | ICD-10-CM | POA: Insufficient documentation

## 2019-10-02 DIAGNOSIS — E119 Type 2 diabetes mellitus without complications: Secondary | ICD-10-CM | POA: Diagnosis not present

## 2019-10-02 DIAGNOSIS — Z833 Family history of diabetes mellitus: Secondary | ICD-10-CM | POA: Insufficient documentation

## 2019-10-02 HISTORY — PX: TOTAL KNEE ARTHROPLASTY: SHX125

## 2019-10-02 LAB — GLUCOSE, CAPILLARY
Glucose-Capillary: 124 mg/dL — ABNORMAL HIGH (ref 70–99)
Glucose-Capillary: 103 mg/dL — ABNORMAL HIGH (ref 70–99)
Glucose-Capillary: 264 mg/dL — ABNORMAL HIGH (ref 70–99)

## 2019-10-02 LAB — TYPE AND SCREEN
ABO/RH(D): O POS
Antibody Screen: NEGATIVE

## 2019-10-02 SURGERY — ARTHROPLASTY, KNEE, TOTAL
Anesthesia: Spinal | Site: Knee | Laterality: Right

## 2019-10-02 MED ORDER — LACTATED RINGERS IV SOLN: INTRAVENOUS | Status: DC

## 2019-10-02 MED ORDER — PROPOFOL 500 MG/50ML IV EMUL
INTRAVENOUS | Status: DC | PRN
  Administered 2019-10-02: 50 ug/kg/min via INTRAVENOUS

## 2019-10-02 MED ORDER — HYDROCODONE-ACETAMINOPHEN 7.5-325 MG PO TABS
1.0000 | ORAL_TABLET | ORAL | Status: DC | PRN
  Administered 2019-10-02: 1 via ORAL

## 2019-10-02 MED ORDER — SCOPOLAMINE 1 MG/3DAYS TD PT72
1.0000 | MEDICATED_PATCH | TRANSDERMAL | Status: DC
  Administered 2019-10-02: 09:00:00 1.5 mg via TRANSDERMAL
  Filled 2019-10-02: qty 1

## 2019-10-02 MED ORDER — HYDROCODONE-ACETAMINOPHEN 5-325 MG PO TABS
ORAL_TABLET | ORAL | Status: AC
  Filled 2019-10-02: qty 1

## 2019-10-02 MED ORDER — HYDROCODONE-ACETAMINOPHEN 7.5-325 MG PO TABS
1.0000 | ORAL_TABLET | Freq: Once | ORAL | Status: AC
  Administered 2019-10-02: 1 via ORAL

## 2019-10-02 MED ORDER — METHOCARBAMOL 500 MG IVPB - SIMPLE MED
500.0000 mg | Freq: Four times a day (QID) | INTRAVENOUS | Status: DC | PRN
  Administered 2019-10-02: 14:00:00 500 mg via INTRAVENOUS

## 2019-10-02 MED ORDER — LACTATED RINGERS IV BOLUS
250.0000 mL | Freq: Once | INTRAVENOUS | Status: AC
  Administered 2019-10-02: 250 mL via INTRAVENOUS

## 2019-10-02 MED ORDER — LISINOPRIL 20 MG PO TABS: 30.0000 mg | ORAL_TABLET | Freq: Every day | ORAL | Status: DC

## 2019-10-02 MED ORDER — MELATONIN 5 MG PO TABS: 5.0000 mg | ORAL_TABLET | Freq: Every day | ORAL | Status: DC

## 2019-10-02 MED ORDER — TRANEXAMIC ACID 1000 MG/10ML IV SOLN
INTRAVENOUS | Status: DC | PRN
  Administered 2019-10-02: 2000 mg via TOPICAL

## 2019-10-02 MED ORDER — DIPHENHYDRAMINE HCL 12.5 MG/5ML PO ELIX
12.5000 mg | ORAL_SOLUTION | ORAL | Status: DC | PRN
  Filled 2019-10-02: qty 10

## 2019-10-02 MED ORDER — METHOCARBAMOL 500 MG IVPB - SIMPLE MED
INTRAVENOUS | Status: AC
  Filled 2019-10-02: qty 50

## 2019-10-02 MED ORDER — BUPIVACAINE HCL (PF) 0.25 % IJ SOLN
INTRAMUSCULAR | Status: AC
  Filled 2019-10-02: qty 30

## 2019-10-02 MED ORDER — PHENOL 1.4 % MT LIQD
1.0000 | OROMUCOSAL | Status: DC | PRN
  Filled 2019-10-02: qty 177

## 2019-10-02 MED ORDER — ACETAMINOPHEN 500 MG PO TABS: 500.0000 mg | ORAL_TABLET | Freq: Four times a day (QID) | ORAL | Status: DC

## 2019-10-02 MED ORDER — HYDROCODONE-ACETAMINOPHEN 5-325 MG PO TABS: 1.0000 | ORAL_TABLET | Freq: Four times a day (QID) | ORAL | 0 refills | Status: DC | PRN

## 2019-10-02 MED ORDER — ONDANSETRON HCL 4 MG/2ML IJ SOLN
INTRAMUSCULAR | Status: AC
  Filled 2019-10-02: qty 2

## 2019-10-02 MED ORDER — ALUM & MAG HYDROXIDE-SIMETH 200-200-20 MG/5ML PO SUSP
30.0000 mL | ORAL | Status: DC | PRN
  Filled 2019-10-02: qty 30

## 2019-10-02 MED ORDER — METHOCARBAMOL 500 MG PO TABS: 500.0000 mg | ORAL_TABLET | Freq: Four times a day (QID) | ORAL | Status: DC | PRN

## 2019-10-02 MED ORDER — SODIUM CHLORIDE 0.9 % IR SOLN
Status: DC | PRN
  Administered 2019-10-02: 3000 mL

## 2019-10-02 MED ORDER — BUPIVACAINE HCL (PF) 0.25 % IJ SOLN
INTRAMUSCULAR | Status: DC | PRN
  Administered 2019-10-02: 30 mL

## 2019-10-02 MED ORDER — INSULIN GLARGINE 100 UNIT/ML ~~LOC~~ SOLN
32.0000 [IU] | Freq: Two times a day (BID) | SUBCUTANEOUS | Status: DC
  Filled 2019-10-02 (×3): qty 0.32

## 2019-10-02 MED ORDER — PROPOFOL 10 MG/ML IV BOLUS
INTRAVENOUS | Status: AC
  Filled 2019-10-02: qty 20

## 2019-10-02 MED ORDER — CEFAZOLIN SODIUM-DEXTROSE 2-4 GM/100ML-% IV SOLN
2.0000 g | INTRAVENOUS | Status: AC
  Administered 2019-10-02: 2 g via INTRAVENOUS
  Filled 2019-10-02: qty 100

## 2019-10-02 MED ORDER — CEFAZOLIN SODIUM-DEXTROSE 2-4 GM/100ML-% IV SOLN
2.0000 g | Freq: Four times a day (QID) | INTRAVENOUS | Status: DC
  Administered 2019-10-02: 16:00:00 2 g via INTRAVENOUS

## 2019-10-02 MED ORDER — METOCLOPRAMIDE HCL 5 MG/ML IJ SOLN: 10.0000 mg | Freq: Once | INTRAMUSCULAR | Status: DC | PRN

## 2019-10-02 MED ORDER — CEFAZOLIN SODIUM-DEXTROSE 2-4 GM/100ML-% IV SOLN
INTRAVENOUS | Status: AC
  Filled 2019-10-02: qty 100

## 2019-10-02 MED ORDER — MIDAZOLAM HCL 2 MG/2ML IJ SOLN
1.0000 mg | Freq: Once | INTRAMUSCULAR | Status: AC
  Administered 2019-10-02: 09:00:00 2 mg via INTRAVENOUS
  Filled 2019-10-02: qty 2

## 2019-10-02 MED ORDER — METOCLOPRAMIDE HCL 5 MG PO TABS
5.0000 mg | ORAL_TABLET | Freq: Three times a day (TID) | ORAL | Status: DC | PRN
  Filled 2019-10-02: qty 2

## 2019-10-02 MED ORDER — EPHEDRINE 5 MG/ML INJ
INTRAVENOUS | Status: AC
  Filled 2019-10-02: qty 10

## 2019-10-02 MED ORDER — BUPIVACAINE LIPOSOME 1.3 % IJ SUSP
INTRAMUSCULAR | Status: DC | PRN
  Administered 2019-10-02: 20 mL

## 2019-10-02 MED ORDER — TRANEXAMIC ACID-NACL 1000-0.7 MG/100ML-% IV SOLN
1000.0000 mg | INTRAVENOUS | Status: AC
  Administered 2019-10-02: 1000 mg via INTRAVENOUS
  Filled 2019-10-02: qty 100

## 2019-10-02 MED ORDER — METFORMIN HCL 500 MG PO TABS
500.0000 mg | ORAL_TABLET | Freq: Two times a day (BID) | ORAL | Status: DC
  Administered 2019-10-02: 18:00:00 500 mg via ORAL
  Filled 2019-10-02: qty 1

## 2019-10-02 MED ORDER — BUPIVACAINE IN DEXTROSE 0.75-8.25 % IT SOLN
INTRATHECAL | Status: DC | PRN
  Administered 2019-10-02: 1.6 mg via INTRATHECAL

## 2019-10-02 MED ORDER — ACETAMINOPHEN 325 MG PO TABS: 325.0000 mg | ORAL_TABLET | Freq: Four times a day (QID) | ORAL | Status: DC | PRN

## 2019-10-02 MED ORDER — MEPERIDINE HCL 50 MG/ML IJ SOLN: 6.2500 mg | INTRAMUSCULAR | Status: DC | PRN

## 2019-10-02 MED ORDER — KETOROLAC TROMETHAMINE 15 MG/ML IJ SOLN
INTRAMUSCULAR | Status: AC
  Filled 2019-10-02: qty 1

## 2019-10-02 MED ORDER — TRANEXAMIC ACID-NACL 1000-0.7 MG/100ML-% IV SOLN
INTRAVENOUS | Status: AC
  Filled 2019-10-02: qty 100

## 2019-10-02 MED ORDER — MENTHOL 3 MG MT LOZG
1.0000 | LOZENGE | OROMUCOSAL | Status: DC | PRN
  Filled 2019-10-02: qty 9

## 2019-10-02 MED ORDER — ROPIVACAINE HCL 7.5 MG/ML IJ SOLN
INTRAMUSCULAR | Status: DC | PRN
  Administered 2019-10-02: 20 mL via PERINEURAL

## 2019-10-02 MED ORDER — KETOROLAC TROMETHAMINE 15 MG/ML IJ SOLN
15.0000 mg | Freq: Four times a day (QID) | INTRAMUSCULAR | Status: DC
  Administered 2019-10-02: 15 mg via INTRAVENOUS

## 2019-10-02 MED ORDER — ASPIRIN EC 81 MG PO TBEC: 81.0000 mg | DELAYED_RELEASE_TABLET | Freq: Every day | ORAL | 0 refills | Status: AC

## 2019-10-02 MED ORDER — LIDOCAINE 2% (20 MG/ML) 5 ML SYRINGE
INTRAMUSCULAR | Status: DC | PRN
  Administered 2019-10-02: 20 mg via INTRAVENOUS

## 2019-10-02 MED ORDER — ONDANSETRON HCL 4 MG/2ML IJ SOLN: 4.0000 mg | Freq: Four times a day (QID) | INTRAMUSCULAR | Status: DC | PRN

## 2019-10-02 MED ORDER — 0.9 % SODIUM CHLORIDE (POUR BTL) OPTIME
TOPICAL | Status: DC | PRN
  Administered 2019-10-02: 1000 mL

## 2019-10-02 MED ORDER — ONDANSETRON HCL 4 MG PO TABS
4.0000 mg | ORAL_TABLET | Freq: Four times a day (QID) | ORAL | Status: DC | PRN
  Filled 2019-10-02: qty 1

## 2019-10-02 MED ORDER — BISACODYL 5 MG PO TBEC
5.0000 mg | DELAYED_RELEASE_TABLET | Freq: Every day | ORAL | Status: DC | PRN
  Filled 2019-10-02: qty 1

## 2019-10-02 MED ORDER — HYDROCODONE-ACETAMINOPHEN 5-325 MG PO TABS: 1.0000 | ORAL_TABLET | Freq: Four times a day (QID) | ORAL | 0 refills | Status: AC | PRN

## 2019-10-02 MED ORDER — DEXAMETHASONE SODIUM PHOSPHATE 10 MG/ML IJ SOLN
INTRAMUSCULAR | Status: AC
  Filled 2019-10-02: qty 1

## 2019-10-02 MED ORDER — PROPOFOL 500 MG/50ML IV EMUL
INTRAVENOUS | Status: AC
  Filled 2019-10-02: qty 50

## 2019-10-02 MED ORDER — EPHEDRINE SULFATE-NACL 50-0.9 MG/10ML-% IV SOSY
PREFILLED_SYRINGE | INTRAVENOUS | Status: DC | PRN
  Administered 2019-10-02: 10 mg via INTRAVENOUS

## 2019-10-02 MED ORDER — MORPHINE SULFATE (PF) 4 MG/ML IV SOLN
INTRAVENOUS | Status: AC
  Filled 2019-10-02: qty 1

## 2019-10-02 MED ORDER — LACTATED RINGERS IV BOLUS
500.0000 mL | Freq: Once | INTRAVENOUS | Status: AC
  Administered 2019-10-02: 12:00:00 500 mL via INTRAVENOUS

## 2019-10-02 MED ORDER — POVIDONE-IODINE 10 % EX SWAB
2.0000 "application " | Freq: Once | CUTANEOUS | Status: AC
  Administered 2019-10-02: 2 via TOPICAL

## 2019-10-02 MED ORDER — PROPOFOL 10 MG/ML IV BOLUS
INTRAVENOUS | Status: DC | PRN
  Administered 2019-10-02: 30 mg via INTRAVENOUS
  Administered 2019-10-02 (×2): 20 mg via INTRAVENOUS
  Administered 2019-10-02: 10 mg via INTRAVENOUS
  Administered 2019-10-02 (×2): 20 mg via INTRAVENOUS

## 2019-10-02 MED ORDER — TIZANIDINE HCL 4 MG PO TABS: 4.0000 mg | ORAL_TABLET | Freq: Four times a day (QID) | ORAL | 1 refills | Status: AC | PRN

## 2019-10-02 MED ORDER — TRANEXAMIC ACID-NACL 1000-0.7 MG/100ML-% IV SOLN
1000.0000 mg | Freq: Once | INTRAVENOUS | Status: AC
  Administered 2019-10-02: 1000 mg via INTRAVENOUS

## 2019-10-02 MED ORDER — DEXAMETHASONE SODIUM PHOSPHATE 10 MG/ML IJ SOLN
INTRAMUSCULAR | Status: DC | PRN
  Administered 2019-10-02: 4 mg via INTRAVENOUS

## 2019-10-02 MED ORDER — HYDROCODONE-ACETAMINOPHEN 7.5-325 MG PO TABS
ORAL_TABLET | ORAL | Status: AC
  Filled 2019-10-02: qty 1

## 2019-10-02 MED ORDER — DOCUSATE SODIUM 100 MG PO CAPS
100.0000 mg | ORAL_CAPSULE | Freq: Two times a day (BID) | ORAL | Status: DC
  Filled 2019-10-02: qty 1

## 2019-10-02 MED ORDER — METOCLOPRAMIDE HCL 5 MG/ML IJ SOLN: 5.0000 mg | Freq: Three times a day (TID) | INTRAMUSCULAR | Status: DC | PRN

## 2019-10-02 MED ORDER — MORPHINE SULFATE (PF) 4 MG/ML IV SOLN
0.5000 mg | INTRAVENOUS | Status: DC | PRN
  Administered 2019-10-02: 1 mg via INTRAVENOUS

## 2019-10-02 MED ORDER — ONDANSETRON HCL 4 MG/2ML IJ SOLN
INTRAMUSCULAR | Status: DC | PRN
  Administered 2019-10-02: 4 mg via INTRAVENOUS

## 2019-10-02 MED ORDER — HYDROCODONE-ACETAMINOPHEN 5-325 MG PO TABS
1.0000 | ORAL_TABLET | ORAL | Status: DC | PRN
  Administered 2019-10-02: 14:00:00 1 via ORAL

## 2019-10-02 MED ORDER — SODIUM CHLORIDE (PF) 0.9 % IJ SOLN
INTRAMUSCULAR | Status: DC | PRN
  Administered 2019-10-02: 30 mL via INTRAVENOUS

## 2019-10-02 MED ORDER — FENTANYL CITRATE (PF) 100 MCG/2ML IJ SOLN: 25.0000 ug | INTRAMUSCULAR | Status: DC | PRN

## 2019-10-02 MED ORDER — STERILE WATER FOR IRRIGATION IR SOLN
Status: DC | PRN
  Administered 2019-10-02 (×2): 1000 mL

## 2019-10-02 MED ORDER — CHLORHEXIDINE GLUCONATE 4 % EX LIQD: 60.0000 mL | Freq: Once | CUTANEOUS | Status: DC

## 2019-10-02 MED ORDER — SODIUM CHLORIDE (PF) 0.9 % IJ SOLN
INTRAMUSCULAR | Status: AC
  Filled 2019-10-02: qty 50

## 2019-10-02 MED ORDER — FENTANYL CITRATE (PF) 100 MCG/2ML IJ SOLN
50.0000 ug | Freq: Once | INTRAMUSCULAR | Status: AC
  Administered 2019-10-02: 09:00:00 50 ug via INTRAVENOUS
  Filled 2019-10-02: qty 2

## 2019-10-02 MED ORDER — ASPIRIN 81 MG PO CHEW: 81.0000 mg | CHEWABLE_TABLET | Freq: Two times a day (BID) | ORAL | Status: DC

## 2019-10-02 SURGICAL SUPPLY — 51 items
ATTUNE MED DOME PAT 32 KNEE (Knees) ×2 IMPLANT
ATTUNE MED DOME PAT 32MM KNEE (Knees) ×1 IMPLANT
ATTUNE PS FEM RT SZ 4 CEM KNEE (Femur) ×3 IMPLANT
ATTUNE PSRP INSR SZ4 5 KNEE (Insert) ×2 IMPLANT
ATTUNE PSRP INSR SZ4 5MM KNEE (Insert) ×1 IMPLANT
BAG DECANTER FOR FLEXI CONT (MISCELLANEOUS) ×3 IMPLANT
BAG ZIPLOCK 12X15 (MISCELLANEOUS) ×3 IMPLANT
BASEPLATE TIBIAL ROTATING SZ 4 (Knees) ×3 IMPLANT
BLADE SAGITTAL 25.0X1.19X90 (BLADE) ×2 IMPLANT
BLADE SAGITTAL 25.0X1.19X90MM (BLADE) ×1
BLADE SAW SGTL 11.0X1.19X90.0M (BLADE) ×3 IMPLANT
BNDG ELASTIC 6X5.8 VLCR STR LF (GAUZE/BANDAGES/DRESSINGS) ×3 IMPLANT
BOOTIES KNEE HIGH SLOAN (MISCELLANEOUS) ×3 IMPLANT
BOWL SMART MIX CTS (DISPOSABLE) ×3 IMPLANT
CEMENT HV SMART SET (Cement) ×6 IMPLANT
COVER SURGICAL LIGHT HANDLE (MISCELLANEOUS) ×3 IMPLANT
COVER WAND RF STERILE (DRAPES) ×3 IMPLANT
CUFF TOURN SGL QUICK 34 (TOURNIQUET CUFF) ×2
CUFF TRNQT CYL 34X4.125X (TOURNIQUET CUFF) ×1 IMPLANT
DECANTER SPIKE VIAL GLASS SM (MISCELLANEOUS) ×6 IMPLANT
DRAPE SHEET LG 3/4 BI-LAMINATE (DRAPES) ×3 IMPLANT
DRAPE TOP 10253 STERILE (DRAPES) ×3 IMPLANT
DRAPE U-SHAPE 47X51 STRL (DRAPES) ×3 IMPLANT
DRSG AQUACEL AG ADV 3.5X10 (GAUZE/BANDAGES/DRESSINGS) ×3 IMPLANT
DURAPREP 26ML APPLICATOR (WOUND CARE) ×6 IMPLANT
ELECT REM PT RETURN 15FT ADLT (MISCELLANEOUS) ×3 IMPLANT
GLOVE BIO SURGEON STRL SZ8 (GLOVE) ×6 IMPLANT
GLOVE BIOGEL PI IND STRL 8 (GLOVE) ×2 IMPLANT
GLOVE BIOGEL PI INDICATOR 8 (GLOVE) ×4
GOWN STRL REUS W/TWL XL LVL3 (GOWN DISPOSABLE) ×6 IMPLANT
HANDPIECE INTERPULSE COAX TIP (DISPOSABLE) ×2
HOLDER FOLEY CATH W/STRAP (MISCELLANEOUS) IMPLANT
HOOD PEEL AWAY FLYTE STAYCOOL (MISCELLANEOUS) ×9 IMPLANT
KIT TURNOVER KIT A (KITS) IMPLANT
MANIFOLD NEPTUNE II (INSTRUMENTS) ×3 IMPLANT
NS IRRIG 1000ML POUR BTL (IV SOLUTION) ×3 IMPLANT
PACK TOTAL KNEE CUSTOM (KITS) ×3 IMPLANT
PAD ARMBOARD 7.5X6 YLW CONV (MISCELLANEOUS) ×3 IMPLANT
PENCIL SMOKE EVACUATOR (MISCELLANEOUS) IMPLANT
PIN DRILL FIX HALF THREAD (BIT) ×3 IMPLANT
PIN STEINMAN FIXATION KNEE (PIN) ×3 IMPLANT
PROTECTOR NERVE ULNAR (MISCELLANEOUS) ×3 IMPLANT
SET HNDPC FAN SPRY TIP SCT (DISPOSABLE) ×1 IMPLANT
SUT ETHIBOND NAB CT1 #1 30IN (SUTURE) ×6 IMPLANT
SUT VIC AB 0 CT1 36 (SUTURE) ×3 IMPLANT
SUT VIC AB 2-0 CT1 27 (SUTURE) ×2
SUT VIC AB 2-0 CT1 TAPERPNT 27 (SUTURE) ×1 IMPLANT
SUT VICRYL AB 3-0 FS1 BRD 27IN (SUTURE) ×3 IMPLANT
TRAY FOLEY MTR SLVR 16FR STAT (SET/KITS/TRAYS/PACK) IMPLANT
WATER STERILE IRR 1000ML POUR (IV SOLUTION) ×3 IMPLANT
WRAP KNEE MAXI GEL POST OP (GAUZE/BANDAGES/DRESSINGS) ×3 IMPLANT

## 2019-10-02 NOTE — Op Note (Signed)
PREOP DIAGNOSIS: DJD RIGHT KNEE POSTOP DIAGNOSIS: same PROCEDURE: RIGHT TKR ANESTHESIA: Spinal and MAC ATTENDING SURGEON: Hessie Dibble ASSISTANT: Loni Dolly PA  INDICATIONS FOR PROCEDURE: Catherine Newman is a 54 y.o. female who has struggled for a long time with pain due to degenerative arthritis of the right knee.  The patient has failed many conservative non-operative measures and at this point has pain which limits the ability to sleep and walk.  The patient is offered total knee replacement.  Informed operative consent was obtained after discussion of possible risks of anesthesia, infection, neurovascular injury, DVT, and death.  The importance of the post-operative rehabilitation protocol to optimize result was stressed extensively with the patient.  SUMMARY OF FINDINGS AND PROCEDURE:  Catherine Newman was taken to the operative suite where under the above anesthesia a right knee replacement was performed.  There were advanced degenerative changes and the bone quality was good.  We used the DePuy Attune system and placed size 4 femur, 4 tibia, 32 mm all polyethylene patella, and a size 5 mm spacer.  Loni Dolly PA-C assisted throughout and was invaluable to the completion of the case in that he helped retract and maintain exposure while I placed components.  He also helped close thereby minimizing OR time.  The patient was admitted for appropriate post-op care to include perioperative antibiotics and mechanical and pharmacologic measures for DVT prophylaxis.  DESCRIPTION OF PROCEDURE:  Catherine Newman was taken to the operative suite where the above anesthesia was applied.  The patient was positioned supine and prepped and draped in normal sterile fashion.  An appropriate time out was performed.  After the administration of kefzol pre-op antibiotic the leg was elevated and exsanguinated and a tourniquet inflated. A standard longitudinal incision was made on the anterior knee.  Dissection was carried down to  the extensor mechanism.  All appropriate anti-infective measures were used including the pre-operative antibiotic, betadine impregnated drape, and closed hooded exhaust systems for each member of the surgical team.  A medial parapatellar incision was made in the extensor mechanism and the knee cap flipped and the knee flexed.  Some residual meniscal tissues were removed along with any remaining ACL/PCL tissue.  A guide was placed on the tibia and a flat cut was made on it's superior surface.  An intramedullary guide was placed in the femur and was utilized to make anterior and posterior cuts creating an appropriate flexion gap.  A second intramedullary guide was placed in the femur to make a distal cut properly balancing the knee with an extension gap equal to the flexion gap.  The three bones sized to the above mentioned sizes and the appropriate guides were placed and utilized.  A trial reduction was done and the knee easily came to full extension and the patella tracked well on flexion.  The trial components were removed and all bones were cleaned with pulsatile lavage and then dried thoroughly.  Cement was mixed and was pressurized onto the bones followed by placement of the aforementioned components.  Excess cement was trimmed and pressure was held on the components until the cement had hardened.  The tourniquet was deflated and a small amount of bleeding was controlled with cautery and pressure.  The knee was irrigated thoroughly.  The extensor mechanism was re-approximated with #1 ethibond in interrupted fashion.  The knee was flexed and the repair was solid.  The subcutaneous tissues were re-approximated with #0 and #2-0 vicryl and the skin closed with a subcuticular stitch  and steristrips.  A sterile dressing was applied.  Intraoperative fluids, EBL, and tourniquet time can be obtained from anesthesia records.  DISPOSITION:  The patient was taken to recovery room in stable condition and admitted for  appropriate post-op care to include peri-operative antibiotic and DVT prophylaxis with mechanical and pharmacologic measures.  Hessie Dibble 10/02/2019, 11:37 AM

## 2019-10-02 NOTE — Anesthesia Procedure Notes (Signed)
Spinal  Patient location during procedure: OR Start time: 10/02/2019 10:03 AM End time: 10/02/2019 10:06 AM Staffing Performed: resident/CRNA  Anesthesiologist: Mal Amabile, MD Resident/CRNA: Epimenio Sarin, CRNA Preanesthetic Checklist Completed: patient identified, IV checked, risks and benefits discussed, surgical consent, monitors and equipment checked, pre-op evaluation and timeout performed Spinal Block Patient position: sitting Prep: DuraPrep Patient monitoring: heart rate, cardiac monitor, continuous pulse ox and blood pressure Approach: midline Location: L3-4 Injection technique: single-shot Needle Needle type: Pencan  Needle gauge: 24 G Needle length: 10 cm Needle insertion depth: 7.5 cm Assessment Sensory level: T6

## 2019-10-02 NOTE — Progress Notes (Signed)
AssistedDr. Foster with right, ultrasound guided, adductor canal block. Side rails up, monitors on throughout procedure. See vital signs in flow sheet. Tolerated Procedure well.  

## 2019-10-02 NOTE — Transfer of Care (Signed)
Immediate Anesthesia Transfer of Care Note  Patient: Catherine Newman  Procedure(s) Performed: RIGHT TOTAL KNEE ARTHROPLASTY (Right Knee)  Patient Location: PACU  Anesthesia Type:Spinal  Level of Consciousness: drowsy and patient cooperative  Airway & Oxygen Therapy: Patient Spontanous Breathing and Patient connected to face mask oxygen  Post-op Assessment: Report given to RN and Post -op Vital signs reviewed and stable  Post vital signs: Reviewed and stable  Last Vitals:  Vitals Value Taken Time  BP 135/67 10/02/19 1204  Temp 36.4 C 10/02/19 1204  Pulse 73 10/02/19 1206  Resp 15 10/02/19 1206  SpO2 100 % 10/02/19 1206  Vitals shown include unvalidated device data.  Last Pain:  Vitals:   10/02/19 0920  TempSrc:   PainSc: 0-No pain         Complications: No apparent anesthesia complications

## 2019-10-02 NOTE — Anesthesia Postprocedure Evaluation (Signed)
Anesthesia Post Note  Patient: Catherine Newman  Procedure(s) Performed: RIGHT TOTAL KNEE ARTHROPLASTY (Right Knee)     Patient location during evaluation: PACU Anesthesia Type: Spinal Level of consciousness: oriented and awake and alert Pain management: pain level controlled Vital Signs Assessment: post-procedure vital signs reviewed and stable Respiratory status: spontaneous breathing, respiratory function stable and nonlabored ventilation Cardiovascular status: blood pressure returned to baseline and stable Postop Assessment: no headache, no backache, no apparent nausea or vomiting, spinal receding and patient able to bend at knees Anesthetic complications: no    Last Vitals:  Vitals:   10/02/19 1215 10/02/19 1230  BP: (!) 149/75 (!) 165/77  Pulse: 69 73  Resp: 10 14  Temp:    SpO2: 95% 94%    Last Pain:  Vitals:   10/02/19 1230  TempSrc:   PainSc: Asleep                 [name] A.

## 2019-10-02 NOTE — Interval H&P Note (Signed)
History and Physical Interval Note:  10/02/2019 9:06 AM  Catherine Newman  has presented today for surgery, with the diagnosis of RIGHT KNEE DEGENERATIVE JOINT DISEASE.  The various methods of treatment have been discussed with the patient and family. After consideration of risks, benefits and other options for treatment, the patient has consented to  Procedure(s): RIGHT TOTAL KNEE ARTHROPLASTY (Right) as a surgical intervention.  The patient's history has been reviewed, patient examined, no change in status, stable for surgery.  I have reviewed the patient's chart and labs.  Questions were answered to the patient's satisfaction.     Velna Ochs

## 2019-10-02 NOTE — Evaluation (Signed)
Physical Therapy Evaluation Patient Details Name: Catherine Newman MRN: [id number] DOB: 1965-12-25 Today's Date: 10/02/2019   History of Present Illness  R TKA; PMH of DM  Clinical Impression  Pt is ready to DC home from PT standpoint. She ambulated 69' with RW, completed stair training, and demonstrates good understanding of HEP.     Follow Up Recommendations Follow surgeon's recommendation for DC plan and follow-up therapies    Equipment Recommendations  None recommended by PT    Recommendations for Other Services       Precautions / Restrictions Precautions Precautions: Knee Precaution Booklet Issued: Yes (comment) Precaution Comments: reviewed no pillow under knee Restrictions Weight Bearing Restrictions: No Other Position/Activity Restrictions: WBAT      Mobility  Bed Mobility Overal bed mobility: Needs Assistance Bed Mobility: Supine to Sit     Supine to sit: Supervision;HOB elevated     General bed mobility comments: HOB up, VCs to self assist RLE with LLE  Transfers Overall transfer level: Needs assistance Equipment used: Rolling walker (2 wheeled) Transfers: Sit to/from Stand Sit to Stand: Min guard         General transfer comment: VCs hand placement  Ambulation/Gait Ambulation/Gait assistance: Supervision Gait Distance (Feet): 110 Feet Assistive device: Rolling walker (2 wheeled) Gait Pattern/deviations: Step-to pattern;Decreased stride length Gait velocity: decr   General Gait Details: VCs sequencing, no loss of balance  Stairs Stairs: Yes Stairs assistance: Min guard Stair Management: No rails;With walker;Backwards Number of Stairs: 1 General stair comments: also 3 stairs forwards with R rail and SPC, VCs sequencing  Wheelchair Mobility    Modified Rankin (Stroke Patients Only)       Balance Overall balance assessment: Modified Independent                                           Pertinent Vitals/Pain Pain  Assessment: 0-10 Pain Score: 4  Pain Location: R knee Pain Descriptors / Indicators: Sore Pain Intervention(s): Limited activity within patient's tolerance;Monitored during session;Premedicated before session;Ice applied    Home Living Family/patient expects to be discharged to:: Private residence Living Arrangements: Spouse/significant other;Children Available Help at Discharge: Family;Available 24 hours/day Type of Home: House Home Access: Stairs to enter   [employer] of Steps: 1 STE no rail vs. 4 STE B rail cannot reach both Home Layout: One level Home Equipment: Walker - 2 wheels;Bedside commode;Cane - single point      Prior Function Level of Independence: Independent               Hand Dominance        Extremity/Trunk Assessment   Upper Extremity Assessment Upper Extremity Assessment: Overall WFL for tasks assessed    Lower Extremity Assessment Lower Extremity Assessment: RLE deficits/detail RLE Deficits / Details: -3/5 SLR, +2/5 knee ext RLE Sensation: decreased light touch(decr light touch around knee) RLE Coordination: WNL    Cervical / Trunk Assessment Cervical / Trunk Assessment: Normal  Communication   Communication: No difficulties  Cognition Arousal/Alertness: Awake/alert Behavior During Therapy: WFL for tasks assessed/performed Overall Cognitive Status: Within Functional Limits for tasks assessed                                        General Comments      Exercises Total Joint Exercises Ankle  Circles/Pumps: AROM;Both;10 reps;Supine Quad Sets: AROM;Right;5 reps;Supine Short Arc Quad: AROM;Right;5 reps;Supine Heel Slides: AAROM;Right;5 reps;Supine Hip ABduction/ADduction: AROM;Right;5 reps;Supine Straight Leg Raises: AAROM;Right;5 reps;Supine Long Arc Quad: AROM;Right;5 reps;Seated Knee Flexion: AAROM;Right;5 reps;Seated Goniometric ROM: 5-45* AAROM R knee   Assessment/Plan    PT Assessment All further PT  needs can be met in the next venue of care  PT Problem List Decreased strength;Decreased range of motion;Decreased activity tolerance;Decreased mobility;Pain;Decreased knowledge of use of DME       PT Treatment Interventions      PT Goals (Current goals can be found in the Care Plan section)  Acute Rehab PT Goals PT Goal Formulation: All assessment and education complete, DC therapy    Frequency     Barriers to discharge        Co-evaluation               AM-PAC PT "6 Clicks" Mobility  Outcome Measure Help needed turning from your back to your side while in a flat bed without using bedrails?: A Little Help needed moving from lying on your back to sitting on the side of a flat bed without using bedrails?: A Little Help needed moving to and from a bed to a chair (including a wheelchair)?: A Little Help needed standing up from a chair using your arms (e.g., wheelchair or bedside chair)?: A Little Help needed to walk in hospital room?: A Little Help needed climbing 3-5 steps with a railing? : A Little 6 Click Score: 18    End of Session Equipment Utilized During Treatment: Gait belt Activity Tolerance: Patient limited by pain;Patient limited by fatigue Patient left: in chair;with call bell/phone within reach Nurse Communication: Mobility status PT Visit Diagnosis: Pain;Muscle weakness (generalized) (M62.81);Difficulty in walking, not elsewhere classified (R26.2) Pain - Right/Left: Right Pain - part of body: Knee    Time: 1627-1710 PT Time Calculation (min) (ACUTE ONLY): 43 min   Charges:   PT Evaluation $PT Eval Low Complexity: 1 Low PT Treatments $Gait Training: 8-22 mins $Therapeutic Exercise: 8-22 mins       Blondell Reveal Kistler PT 10/02/2019  Acute Rehabilitation Services Pager [phone] Office [phone]

## 2019-10-02 NOTE — Anesthesia Procedure Notes (Signed)
Anesthesia Regional Block: Adductor canal block   Pre-Anesthetic Checklist: ,, timeout performed, Correct Patient, Correct Site, Correct Laterality, Correct Procedure, Correct Position, site marked, Risks and benefits discussed,  Surgical consent,  Pre-op evaluation,  At surgeon's request and post-op pain management  Laterality: Right  Prep: chloraprep       Needles:  Injection technique: Single-shot  Needle Type: Echogenic Stimulator Needle     Needle Length: 9cm  Needle Gauge: 21   Needle insertion depth: 8 cm   Additional Needles:   Procedures:,,,, ultrasound used (permanent image in chart),,,,  Narrative:  Start time: 10/02/2019 9:15 AM End time: 10/02/2019 9:20 AM Injection made incrementally with aspirations every 5 mL.  Performed by: Personally  Anesthesiologist: Mal Amabile, MD  Additional Notes: Timeout performed. Patient sedated. Relevant anatomy ID'd using [place]. Incremental 2-68ml injection of LA with frequent aspiration. Patient tolerated procedure well.        Right Adductor Canal Block

## 2019-10-04 ENCOUNTER — Encounter: Payer: Self-pay | Admitting: *Deleted
